# Patient Record
Sex: Female | Born: 1980 | Race: White | Hispanic: No | Marital: Married | Smoking: Never smoker
Health system: Southern US, Community
[De-identification: ages and names within clinical notes are randomized; demographics above are authoritative.]

## PROBLEM LIST (undated history)

## (undated) DIAGNOSIS — L408 Other psoriasis: Secondary | ICD-10-CM

## (undated) DIAGNOSIS — R229 Localized swelling, mass and lump, unspecified: Secondary | ICD-10-CM

## (undated) DIAGNOSIS — Z20828 Contact with and (suspected) exposure to other viral communicable diseases: Secondary | ICD-10-CM

## (undated) DIAGNOSIS — N390 Urinary tract infection, site not specified: Secondary | ICD-10-CM

## (undated) DIAGNOSIS — B343 Parvovirus infection, unspecified: Secondary | ICD-10-CM

## (undated) DIAGNOSIS — Z23 Encounter for immunization: Secondary | ICD-10-CM

## (undated) DIAGNOSIS — Z8619 Personal history of other infectious and parasitic diseases: Secondary | ICD-10-CM

## (undated) DIAGNOSIS — M545 Low back pain, unspecified: Secondary | ICD-10-CM

## (undated) DIAGNOSIS — O98519 Other viral diseases complicating pregnancy, unspecified trimester: Secondary | ICD-10-CM

## (undated) DIAGNOSIS — O368190 Decreased fetal movements, unspecified trimester, not applicable or unspecified: Secondary | ICD-10-CM

## (undated) HISTORY — DX: Decreased fetal movements, unspecified trimester, not applicable or unspecified: O36.8190

## (undated) HISTORY — DX: Other viral diseases complicating pregnancy, unspecified trimester: B34.3

## (undated) HISTORY — DX: Low back pain: M54.5

## (undated) HISTORY — DX: Other viral diseases complicating pregnancy, unspecified trimester: O98.519

## (undated) HISTORY — DX: Personal history of other infectious and parasitic diseases: Z86.19

## (undated) HISTORY — DX: Contact with and (suspected) exposure to other viral communicable diseases: Z20.828

## (undated) HISTORY — DX: Localized swelling, mass and lump, unspecified: R22.9

## (undated) HISTORY — DX: Encounter for immunization: Z23

## (undated) HISTORY — DX: Urinary tract infection, site not specified: N39.0

## (undated) HISTORY — DX: Other psoriasis: L40.8

## (undated) HISTORY — DX: Low back pain, unspecified: M54.50

---

## 2004-01-30 ENCOUNTER — Other Ambulatory Visit: Admission: RE | Admit: 2004-01-30 | Discharge: 2004-01-30 | Payer: Self-pay | Admitting: Obstetrics and Gynecology

## 2005-04-21 ENCOUNTER — Other Ambulatory Visit: Admission: RE | Admit: 2005-04-21 | Discharge: 2005-04-21 | Payer: Self-pay | Admitting: Obstetrics and Gynecology

## 2006-07-28 HISTORY — PX: CHOLECYSTECTOMY: SHX55

## 2006-08-24 ENCOUNTER — Encounter: Payer: Self-pay | Admitting: Maternal & Fetal Medicine

## 2007-01-15 ENCOUNTER — Observation Stay: Payer: Self-pay | Admitting: Obstetrics and Gynecology

## 2007-01-24 ENCOUNTER — Observation Stay: Payer: Self-pay | Admitting: Certified Nurse Midwife

## 2007-01-25 ENCOUNTER — Inpatient Hospital Stay: Payer: Self-pay | Admitting: Obstetrics and Gynecology

## 2007-01-25 ENCOUNTER — Observation Stay: Payer: Self-pay | Admitting: Certified Nurse Midwife

## 2007-02-22 ENCOUNTER — Observation Stay (HOSPITAL_COMMUNITY): Admission: EM | Admit: 2007-02-22 | Discharge: 2007-02-22 | Payer: Self-pay | Admitting: Family Medicine

## 2007-02-22 ENCOUNTER — Encounter (INDEPENDENT_AMBULATORY_CARE_PROVIDER_SITE_OTHER): Payer: Self-pay | Admitting: General Surgery

## 2007-06-16 ENCOUNTER — Encounter: Admission: RE | Admit: 2007-06-16 | Discharge: 2007-06-16 | Payer: Self-pay | Admitting: Family Medicine

## 2009-01-11 ENCOUNTER — Encounter: Admission: RE | Admit: 2009-01-11 | Discharge: 2009-01-11 | Payer: Self-pay | Admitting: Internal Medicine

## 2010-07-28 NOTE — L&D Delivery Note (Signed)
Delivery Note  Complete dilation at 2029 Onset of pushing at 2035 FHR second stage 135 - mild variables audible to 100 with pushing  Anesthesia: epidural  Delivery of a viable female at 2055 by CNM in LOA position.  Nuchal Cord x1 reduced down body at birth Cord double clamped after cessation of pulsation, cut by FOB.  Cord blood sample collected.  Placenta delivered shultz intact with 3 VC.  Placenta to pathology for parvo infection antepartum. Uterine tone firm bleeding small  supraurethral laceration identified with brisk bleeding - repair with 4-0 vicryl / good hemostasis - no extension to urethra Repair partial 1st degree perineal / 4-0 vicryl subcuticular x 4 Est. Blood Loss (mL):   Complications: none  Mom to postpartum.  Baby to nursery-stable.  BAILEY,TANYA 07/02/2011, 9:22 PM

## 2010-12-10 NOTE — H&P (Signed)
[name], Harrington NO.:  [id number]   MEDICAL RECORD NO.:  [id number]          PATIENT TYPE:  EMS   LOCATION:  MAJO                         FACILITY:  [facility]   PHYSICIAN:  Adolph Pollack, M.D.DATE OF BIRTH:  Nov 26, 1980   DATE OF ADMISSION:  02/21/2007  DATE OF DISCHARGE:                              HISTORY & PHYSICAL   CHIEF COMPLAINT:  Worsening upper abdominal pain.   HISTORY OF PRESENT ILLNESS:  Jenna Harrington is a 30 year old female who is 3  weeks postpartum.  3-4 days ago she developed some pressure-type upper  abdominal pain that had become worse and radiated to the right upper  quadrant.  There has been some nausea and vomiting a few days ago.  She  had a low-grade temperature of 100.2 yesterday and presented to the  emergency department.  Initially she went to Urgent Care and was then  sent to the emergency department and evaluated with a laboratory and an  ultrasound.  The ultrasound demonstrated cholelithiasis with a stone  impacted in the neck of the gallbladder and borderline wall thickening.  She had tenderness in the right upper quadrant.  At this point we were  asked to see her.  She did get a dose of IV Rocephin.   PAST MEDICAL HISTORY:  No chronic illnesses.   PAST SURGICAL HISTORY:  None.   ALLERGIES:  NONE.   MEDICATIONS:  Prenatal vitamins.   SOCIAL HISTORY:  She is married and here with her husband.  No tobacco  or alcohol use.   FAMILY HISTORY:  Negative for gallbladder disease.   REVIEW OF SYSTEMS:  CARDIOVASCULAR:  No hypertension, heart disease.  PULMONARY:  No pneumonia, asthma, COPD.  GI:  No peptic ulcer disease,  hiatal hernia.  GU:  No dysuria, hematuria, or increased frequency.  ENDOCRINE:  No diabetes or hypercholesterolemia.  NEUROLOGIC:  No  seizure disorders.  HEMATOLOGIC:  No bleeding disorders, transfusions,  or blood clots.   PHYSICAL EXAMINATION:  GENERAL:  A mildly overweight female.  She  appears slightly  ill but is in no acute distress.  VITAL SIGNS:  Temperature is 98.8, blood pressure 131/83, pulse 87, O2  saturations 97% on room air.  HEENT:  Eyes:  Extraocular movements intact.  No icterus.  NECK:  Supple without obvious masses.  RESPIRATORY:  Breath sounds equal and clear.  Respirations unlabored.  CARDIOVASCULAR:  Regular rate, regular rhythm.  No murmur.  EXTREMITIES:  No lower extremity edema.  ABDOMEN:  Soft.  There is some right upper quadrant tenderness and  guarding with a Murphy's sign.  Active bowel sounds heard.  No obvious  masses.  MUSCULOSKELETAL:  Good range of motion.  No cyanosis.  SKIN:  No jaundice.   LABORATORY DATA:  White cell count is normal at 9800, hemoglobin 12.9.  Liver function tests within normal limits.  Lipase normal.  Urinalysis  demonstrates many epithelial cells, 21-50 white blood cells, 3-6 red  blood cells, few bacteria.   Ultrasound reviewed.   IMPRESSION:  1. Evolving acute cholecystitis.  2. Possible urinary tract infection, although increased urinary  epithelial cells points to potential contaminated specimen.   PLAN:  Will admit and check a urine culture.  Will start IV antibiotics.  I discussed with her laparoscopic possible open cholecystectomy.  I went  over the procedure and risks including but not limited to bleeding,  infection, wound healing problems, anesthesia, bile leak, accidental  damage to the intestine/liver/bile duct and postcholecystectomy  diarrhea.  She appears to understand this and agrees with the plan.      Adolph Pollack, M.D.  Electronically Signed     TJR/MEDQ  D:  02/22/2007  T:  02/22/2007  Job:  [id number]

## 2010-12-10 NOTE — Op Note (Signed)
[name], Jenna Harrington NO.:  [id number]   MEDICAL RECORD NO.:  [id number]          PATIENT TYPE:  INP   LOCATION:  5703                         FACILITY:  [facility]   PHYSICIAN:  Gabrielle Dare. Janee Morn, M.D.DATE OF BIRTH:  July 24, 1981   DATE OF PROCEDURE:  02/22/2007  DATE OF DISCHARGE:                               OPERATIVE REPORT   PREOPERATIVE DIAGNOSIS:  Acute cholecystitis.   POSTOPERATIVE DIAGNOSIS:  Acute cholecystitis.   PROCEDURE:  Laparoscopic cholecystectomy with intraoperative  cholangiogram.   SURGEON:  Violeta Gelinas, M.D.   ASSISTANT:  Sandria Bales. Ezzard Standing, M.D.   ANESTHESIA:  General.   HISTORY OF PRESENT ILLNESS:  The patient is 30 year old female was  admitted earlier this morning by my partner Dr. Abbey Harrington with acute  cholecystitis.  Liver function tests are normal.  White blood cell count  is within normal limits.  She continues to have pain the right upper  quadrant.  She is brought to operating room for cholecystectomy with  cholangiogram.   PROCEDURE IN DETAIL:  Informed consent was obtained the patient is  receiving intravenous antibiotics and she was identified.  She is  brought to the operating room.  General anesthesia was administered. Her  abdomen was prepped and draped in sterile fashion, infraumbilical region  was infiltrated with quarter percent Marcaine with epinephrine.  Infraumbilical incision was made.  Subcutaneous tissues were dissected  down revealing anterior fascia.  This was divided sharply, peritoneal  cavity was entered under direct vision without difficulty.  The 0 Vicryl  pursestring suture was placed around the fascial opening the Hassan  trocar was inserted into the abdomen.  The abdomen was insufflated with  carbon dioxide in standard fashion. Under direct vision an 11 mm  epigastric and two 5-mm lateral ports were placed. 0.25% Marcaine with  epinephrine was used at all port sites. The gallbladder was noted be  acutely inflamed.  It was very edematous and distended. It was drained  with a new shock drainage system.  This was pale yellow bile. The dome  was then retracted superior medially.  Some filmy omental adhesions were  then gradually swept away with care, eventually the infundibulum was  revealed this was retracted inferolaterally.  Further dissection began  laterally and progressed medially easily identifying the cystic duct.  Dissection continued to a large window was created between the cystic  duct infundibulum and the liver.  Once this was done with excellent  visualization, a clip was placed on the infundibulum cystic duct  junction. Small nick was made in the cystic duct and a Reddick  cholangiogram catheter was inserted.  Intraoperative cholangiogram was  obtained.  This demonstrated no common bile duct filling defects and  good flow of contrast into the duodenum cholangiogram catheter was  removed.  Three clips were placed proximally on the cystic duct and it  was divided.  Further dissection revealed the anterior branch of the  cystic artery.  This was clipped twice proximally once distally and  divided. Gallbladder was taken off the liver bed with Bovie cautery.  We  did come across the  posterior branch of cystic artery.  This was clipped  twice proximally and once distally and divided as well. The gallbladder  was taken off the liver bed again with Bovie cautery.  We encountered  one more vein that was clipped proximally and divided distally with  cautery.  Gallbladder was placed in EndoCatch bag and taken out the  abdomen via the infraumbilical port site.  The abdomen was copiously  irrigated.  Meticulous hemostasis was obtained in the liver bed.  All  clips were in good position and liver bed was dry.  Irrigation fluid was  evacuated.  It was clear. Liver bed and clips were checked one more time  and the area was dry.  The ports were then removed under direct vision.   Pneumoperitoneum was released, the infraumbilical fascia was closed by  tying 0 Vicryl pursestring suture with care not trap any intra-abdominal  contents. That was done after removal of the gallbladder inside the  EndoCatch bag via the infraumbilical port site. All four wounds were  copiously irrigated.  Skin of each was closed with running 4-0 Vicryl  subcuticular stitch.  Sponge, needle, instrument counts were correct.  Benzoin, Steri-Strips and sterile dressings were applied.  The patient  tolerated procedure well without apparent complication was taken to  recovery in stable condition.      Gabrielle Dare Janee Morn, M.D.  Electronically Signed     BET/MEDQ  D:  02/22/2007  T:  02/22/2007  Job:  [id number]

## 2010-12-16 LAB — HEPATITIS B SURFACE ANTIGEN: Hepatitis B Surface Ag: NEGATIVE

## 2010-12-16 LAB — ABO/RH: RH Type: NEGATIVE

## 2010-12-16 LAB — HIV ANTIBODY (ROUTINE TESTING W REFLEX): HIV: NONREACTIVE

## 2011-03-03 ENCOUNTER — Other Ambulatory Visit (HOSPITAL_COMMUNITY): Payer: Self-pay

## 2011-03-03 DIAGNOSIS — O2690 Pregnancy related conditions, unspecified, unspecified trimester: Secondary | ICD-10-CM

## 2011-03-03 DIAGNOSIS — Z20828 Contact with and (suspected) exposure to other viral communicable diseases: Secondary | ICD-10-CM

## 2011-03-05 ENCOUNTER — Ambulatory Visit (HOSPITAL_COMMUNITY)
Admission: RE | Admit: 2011-03-05 | Discharge: 2011-03-05 | Disposition: A | Discharge status: Home / Self Care | Payer: 59 | Source: Ambulatory Visit

## 2011-03-05 ENCOUNTER — Other Ambulatory Visit (HOSPITAL_COMMUNITY): Payer: Self-pay

## 2011-03-05 ENCOUNTER — Encounter (HOSPITAL_COMMUNITY): Payer: Self-pay

## 2011-03-05 DIAGNOSIS — Z20828 Contact with and (suspected) exposure to other viral communicable diseases: Secondary | ICD-10-CM

## 2011-03-05 DIAGNOSIS — O358XX0 Maternal care for other (suspected) fetal abnormality and damage, not applicable or unspecified: Secondary | ICD-10-CM | POA: Insufficient documentation

## 2011-03-05 DIAGNOSIS — O2690 Pregnancy related conditions, unspecified, unspecified trimester: Secondary | ICD-10-CM

## 2011-03-05 DIAGNOSIS — Z363 Encounter for antenatal screening for malformations: Secondary | ICD-10-CM | POA: Insufficient documentation

## 2011-03-05 DIAGNOSIS — Z1389 Encounter for screening for other disorder: Secondary | ICD-10-CM | POA: Insufficient documentation

## 2011-03-05 NOTE — Progress Notes (Signed)
Name: Jenna Harrington Medical Record Number:  [id number] Date of Birth: 25-Aug-1980 Date of Service: 03/05/2011  ORAH Harrington was seen today for prenatal diagnosis secondary to recent parvovirus exposure at the request of Dr. Arlan Organ.  The patient is a 30 y.o. G2P1001, with an EDD of 07/09/11 based on 21 weeks ultrasound.  Her daughter was recently diagnosed with a parvovirus infections.  The patient's IgM was positive and her IgG was equivocal on 02/19/11 suggesting a primary maternal exposure.  The patient has not had any symptoms of infection.  The patient has had weekly ultrasounds since the parvovirus exposure was discovered.  No signs of hydrops were seen.   She was seen today for ultrasound evaluation and maternal fetal medicine consultation.    OB History G1 NSVD at term  Past Medical and Surgical History The patient has no history of chronic medical diseases or prior surgeries.  Family History The patient has a maternal niece with spina bifida.  The father of the baby has a paternal cousin has a cleft lip and another paternal cousin with Downs Syndrome.  The patient and the father of the baby have no other family history of mental retardation, birth defects, or genetic diseases.  Social History The patient does not use tobacco, alcohol, or other illicit drugs.  Physical Examination: BP 97/55, Pulse 93, Weight 198 pounds. General appearance - alert, well appearing, and in no distress Abdomen - soft, nontender, nondistended, gravid.  Fetal Ultrasound A fetal anatomic survey was performed and no fetal anomalies or soft markers of aneuploidy were seen.  MCA peak systolic velocities were moderately elevated at 1.32 MoM.  For full details please see accompanying ultrasound report.  Recommendations 1.  Parvovirus exposure:  The patient's parvovirus serologies suggest a primary maternal infection.  This carries a risk of fetal infection and secondary anemia due to bone marrow  suppression.  I discussed the possibility of proceeding with amniocentesis to determine whether or not fetal infection has occurred, but the patient declined given the risk of miscarriage associated with the procedure.  The MCA dopplers reveal a moderately elevated peak systolic velocity of 1.32 MoM raising concern for moderate anemia; however, a single value can be difficult to interpret.  Although elevated the MCA dopplers do not indicate severe anemia or the need to proceed with fetal blood sampling and possible transfusion at this time.  We will repeat MCA dopplers in one week and weekly thereafter for 10 weeks.  If peak systolic velocity increases above 1.5 MoM we will proceed with fetal blood sampling and in-utero transfusion.  Rema Fendt

## 2011-03-10 ENCOUNTER — Other Ambulatory Visit (HOSPITAL_COMMUNITY): Payer: Self-pay

## 2011-03-10 ENCOUNTER — Ambulatory Visit (HOSPITAL_COMMUNITY)
Admission: RE | Admit: 2011-03-10 | Discharge: 2011-03-10 | Disposition: A | Discharge status: Home / Self Care | Payer: 59 | Source: Ambulatory Visit

## 2011-03-10 DIAGNOSIS — O2690 Pregnancy related conditions, unspecified, unspecified trimester: Secondary | ICD-10-CM

## 2011-03-10 DIAGNOSIS — Z20828 Contact with and (suspected) exposure to other viral communicable diseases: Secondary | ICD-10-CM

## 2011-03-10 DIAGNOSIS — Z3689 Encounter for other specified antenatal screening: Secondary | ICD-10-CM | POA: Insufficient documentation

## 2011-03-17 ENCOUNTER — Ambulatory Visit (HOSPITAL_COMMUNITY)
Admission: RE | Admit: 2011-03-17 | Discharge: 2011-03-17 | Disposition: A | Discharge status: Home / Self Care | Payer: 59 | Source: Ambulatory Visit

## 2011-03-17 DIAGNOSIS — Z20828 Contact with and (suspected) exposure to other viral communicable diseases: Secondary | ICD-10-CM

## 2011-03-17 DIAGNOSIS — Z3689 Encounter for other specified antenatal screening: Secondary | ICD-10-CM | POA: Insufficient documentation

## 2011-03-17 DIAGNOSIS — O2690 Pregnancy related conditions, unspecified, unspecified trimester: Secondary | ICD-10-CM

## 2011-03-17 NOTE — Progress Notes (Signed)
Vital signs reviewed; see ultrasound report 

## 2011-03-24 ENCOUNTER — Ambulatory Visit (HOSPITAL_COMMUNITY)
Admission: RE | Admit: 2011-03-24 | Discharge: 2011-03-24 | Disposition: A | Discharge status: Home / Self Care | Payer: 59 | Source: Ambulatory Visit

## 2011-03-24 DIAGNOSIS — O2690 Pregnancy related conditions, unspecified, unspecified trimester: Secondary | ICD-10-CM

## 2011-03-24 DIAGNOSIS — Z3689 Encounter for other specified antenatal screening: Secondary | ICD-10-CM | POA: Insufficient documentation

## 2011-03-24 DIAGNOSIS — Z20828 Contact with and (suspected) exposure to other viral communicable diseases: Secondary | ICD-10-CM

## 2011-04-01 ENCOUNTER — Other Ambulatory Visit (HOSPITAL_COMMUNITY): Payer: Self-pay

## 2011-04-01 ENCOUNTER — Ambulatory Visit (HOSPITAL_COMMUNITY)
Admission: RE | Admit: 2011-04-01 | Discharge: 2011-04-01 | Disposition: A | Discharge status: Home / Self Care | Payer: 59 | Source: Ambulatory Visit

## 2011-04-01 DIAGNOSIS — O2690 Pregnancy related conditions, unspecified, unspecified trimester: Secondary | ICD-10-CM

## 2011-04-01 DIAGNOSIS — Z3689 Encounter for other specified antenatal screening: Secondary | ICD-10-CM | POA: Insufficient documentation

## 2011-04-01 DIAGNOSIS — Z20828 Contact with and (suspected) exposure to other viral communicable diseases: Secondary | ICD-10-CM

## 2011-04-07 ENCOUNTER — Other Ambulatory Visit (HOSPITAL_COMMUNITY): Payer: 59

## 2011-04-07 ENCOUNTER — Ambulatory Visit (HOSPITAL_COMMUNITY)
Admission: RE | Admit: 2011-04-07 | Discharge: 2011-04-07 | Disposition: A | Discharge status: Home / Self Care | Payer: 59 | Source: Ambulatory Visit

## 2011-04-07 DIAGNOSIS — O2690 Pregnancy related conditions, unspecified, unspecified trimester: Secondary | ICD-10-CM

## 2011-04-07 DIAGNOSIS — Z20828 Contact with and (suspected) exposure to other viral communicable diseases: Secondary | ICD-10-CM

## 2011-04-07 DIAGNOSIS — Z3689 Encounter for other specified antenatal screening: Secondary | ICD-10-CM | POA: Insufficient documentation

## 2011-04-07 NOTE — Progress Notes (Signed)
See [place] report in [facility]

## 2011-04-10 ENCOUNTER — Ambulatory Visit (HOSPITAL_COMMUNITY)
Admission: RE | Admit: 2011-04-10 | Discharge: 2011-04-10 | Disposition: A | Discharge status: Home / Self Care | Payer: 59 | Source: Ambulatory Visit

## 2011-04-10 ENCOUNTER — Other Ambulatory Visit (HOSPITAL_COMMUNITY): Payer: Self-pay

## 2011-04-10 ENCOUNTER — Other Ambulatory Visit (HOSPITAL_COMMUNITY): Payer: Self-pay | Admitting: Maternal and Fetal Medicine

## 2011-04-10 DIAGNOSIS — Z20828 Contact with and (suspected) exposure to other viral communicable diseases: Secondary | ICD-10-CM

## 2011-04-10 DIAGNOSIS — O2690 Pregnancy related conditions, unspecified, unspecified trimester: Secondary | ICD-10-CM

## 2011-04-10 DIAGNOSIS — Z3689 Encounter for other specified antenatal screening: Secondary | ICD-10-CM | POA: Insufficient documentation

## 2011-04-14 ENCOUNTER — Ambulatory Visit (HOSPITAL_COMMUNITY)
Admission: RE | Admit: 2011-04-14 | Discharge: 2011-04-14 | Disposition: A | Discharge status: Home / Self Care | Payer: 59 | Source: Ambulatory Visit

## 2011-04-14 ENCOUNTER — Other Ambulatory Visit (HOSPITAL_COMMUNITY): Payer: 59

## 2011-04-14 DIAGNOSIS — Z20828 Contact with and (suspected) exposure to other viral communicable diseases: Secondary | ICD-10-CM

## 2011-04-14 DIAGNOSIS — O2690 Pregnancy related conditions, unspecified, unspecified trimester: Secondary | ICD-10-CM

## 2011-04-14 DIAGNOSIS — Z3689 Encounter for other specified antenatal screening: Secondary | ICD-10-CM | POA: Insufficient documentation

## 2011-04-14 NOTE — Progress Notes (Signed)
Ultrasound in [facility].  Follow up U/S scheduled. No evidence for fetal anemia.

## 2011-04-21 ENCOUNTER — Ambulatory Visit (HOSPITAL_COMMUNITY)
Admission: RE | Admit: 2011-04-21 | Discharge: 2011-04-21 | Disposition: A | Discharge status: Home / Self Care | Payer: 59 | Source: Ambulatory Visit

## 2011-04-21 ENCOUNTER — Other Ambulatory Visit (HOSPITAL_COMMUNITY): Payer: 59

## 2011-04-21 ENCOUNTER — Other Ambulatory Visit (HOSPITAL_COMMUNITY): Payer: Self-pay | Admitting: Maternal and Fetal Medicine

## 2011-04-21 DIAGNOSIS — Z3689 Encounter for other specified antenatal screening: Secondary | ICD-10-CM | POA: Insufficient documentation

## 2011-04-21 DIAGNOSIS — Z20828 Contact with and (suspected) exposure to other viral communicable diseases: Secondary | ICD-10-CM

## 2011-04-21 DIAGNOSIS — O2690 Pregnancy related conditions, unspecified, unspecified trimester: Secondary | ICD-10-CM

## 2011-04-24 ENCOUNTER — Ambulatory Visit (HOSPITAL_COMMUNITY)
Admission: RE | Admit: 2011-04-24 | Discharge: 2011-04-24 | Disposition: A | Discharge status: Home / Self Care | Payer: 59 | Source: Ambulatory Visit | Attending: Maternal and Fetal Medicine | Admitting: Maternal and Fetal Medicine

## 2011-04-24 ENCOUNTER — Ambulatory Visit (HOSPITAL_COMMUNITY)
Admission: RE | Admit: 2011-04-24 | Discharge: 2011-04-24 | Disposition: A | Discharge status: Home / Self Care | Payer: 59 | Source: Ambulatory Visit

## 2011-04-24 DIAGNOSIS — Z3689 Encounter for other specified antenatal screening: Secondary | ICD-10-CM | POA: Insufficient documentation

## 2011-04-24 DIAGNOSIS — O2690 Pregnancy related conditions, unspecified, unspecified trimester: Secondary | ICD-10-CM

## 2011-04-24 DIAGNOSIS — Z20828 Contact with and (suspected) exposure to other viral communicable diseases: Secondary | ICD-10-CM

## 2011-04-28 ENCOUNTER — Other Ambulatory Visit (HOSPITAL_COMMUNITY): Payer: 59

## 2011-04-28 ENCOUNTER — Ambulatory Visit (HOSPITAL_COMMUNITY)
Admission: RE | Admit: 2011-04-28 | Payer: 59 | Source: Ambulatory Visit | Attending: Maternal and Fetal Medicine | Admitting: Maternal and Fetal Medicine

## 2011-05-01 ENCOUNTER — Ambulatory Visit (HOSPITAL_COMMUNITY)
Admission: RE | Admit: 2011-05-01 | Discharge: 2011-05-01 | Disposition: A | Discharge status: Home / Self Care | Payer: 59 | Source: Ambulatory Visit | Attending: Maternal and Fetal Medicine | Admitting: Maternal and Fetal Medicine

## 2011-05-01 DIAGNOSIS — Z3689 Encounter for other specified antenatal screening: Secondary | ICD-10-CM | POA: Insufficient documentation

## 2011-05-01 DIAGNOSIS — Z20828 Contact with and (suspected) exposure to other viral communicable diseases: Secondary | ICD-10-CM

## 2011-05-01 DIAGNOSIS — O2690 Pregnancy related conditions, unspecified, unspecified trimester: Secondary | ICD-10-CM

## 2011-05-05 ENCOUNTER — Other Ambulatory Visit (HOSPITAL_COMMUNITY): Payer: 59

## 2011-05-05 ENCOUNTER — Ambulatory Visit (HOSPITAL_COMMUNITY): Payer: 59

## 2011-05-07 ENCOUNTER — Ambulatory Visit (HOSPITAL_COMMUNITY)
Admission: RE | Admit: 2011-05-07 | Discharge: 2011-05-07 | Disposition: A | Discharge status: Home / Self Care | Payer: 59 | Source: Ambulatory Visit

## 2011-05-07 DIAGNOSIS — Z20828 Contact with and (suspected) exposure to other viral communicable diseases: Secondary | ICD-10-CM

## 2011-05-07 DIAGNOSIS — O2690 Pregnancy related conditions, unspecified, unspecified trimester: Secondary | ICD-10-CM

## 2011-05-07 DIAGNOSIS — Z3689 Encounter for other specified antenatal screening: Secondary | ICD-10-CM | POA: Insufficient documentation

## 2011-05-12 LAB — DIFFERENTIAL
Lymphocytes Relative: 27
Monocytes Absolute: 0.9 — ABNORMAL HIGH
Monocytes Relative: 9
Neutro Abs: 6
Neutrophils Relative %: 62

## 2011-05-12 LAB — URINE MICROSCOPIC-ADD ON

## 2011-05-12 LAB — URINALYSIS, ROUTINE W REFLEX MICROSCOPIC
Glucose, UA: NEGATIVE
Protein, ur: NEGATIVE
Specific Gravity, Urine: 1.011
Urobilinogen, UA: 0.2

## 2011-05-12 LAB — CBC
HCT: 38.1
MCHC: 34
MCV: 86.2
Platelets: 287
RDW: 13.3

## 2011-05-12 LAB — URINE CULTURE: Colony Count: >=100000

## 2011-05-12 LAB — COMPREHENSIVE METABOLIC PANEL
Albumin: 3.4 — ABNORMAL LOW
BUN: 6
Calcium: 8.8
Creatinine, Ser: 0.71
Glucose, Bld: 96
Total Protein: 6.5

## 2011-05-14 ENCOUNTER — Ambulatory Visit (HOSPITAL_COMMUNITY)
Admission: RE | Admit: 2011-05-14 | Discharge: 2011-05-14 | Disposition: A | Discharge status: Home / Self Care | Payer: 59 | Source: Ambulatory Visit

## 2011-05-14 DIAGNOSIS — Z20828 Contact with and (suspected) exposure to other viral communicable diseases: Secondary | ICD-10-CM

## 2011-05-14 DIAGNOSIS — O99891 Other specified diseases and conditions complicating pregnancy: Secondary | ICD-10-CM | POA: Insufficient documentation

## 2011-05-14 DIAGNOSIS — Z3689 Encounter for other specified antenatal screening: Secondary | ICD-10-CM | POA: Insufficient documentation

## 2011-05-14 NOTE — Progress Notes (Signed)
Report in [facility]; follow-up as needed 

## 2011-05-16 ENCOUNTER — Ambulatory Visit (HOSPITAL_COMMUNITY)
Admission: RE | Admit: 2011-05-16 | Discharge: 2011-05-16 | Disposition: A | Discharge status: Home / Self Care | Payer: 59 | Source: Ambulatory Visit

## 2011-05-16 ENCOUNTER — Other Ambulatory Visit (HOSPITAL_COMMUNITY): Payer: Self-pay | Admitting: Obstetrics and Gynecology

## 2011-05-16 DIAGNOSIS — Z20828 Contact with and (suspected) exposure to other viral communicable diseases: Secondary | ICD-10-CM

## 2011-05-16 DIAGNOSIS — Z3689 Encounter for other specified antenatal screening: Secondary | ICD-10-CM | POA: Insufficient documentation

## 2011-05-20 ENCOUNTER — Ambulatory Visit (HOSPITAL_COMMUNITY)
Admission: RE | Admit: 2011-05-20 | Discharge: 2011-05-20 | Disposition: A | Discharge status: Home / Self Care | Payer: 59 | Source: Ambulatory Visit | Attending: Internal Medicine | Admitting: Internal Medicine

## 2011-05-20 ENCOUNTER — Other Ambulatory Visit (HOSPITAL_COMMUNITY): Payer: Self-pay | Admitting: Maternal and Fetal Medicine

## 2011-05-20 ENCOUNTER — Other Ambulatory Visit (HOSPITAL_COMMUNITY): Payer: Self-pay | Admitting: Obstetrics and Gynecology

## 2011-05-20 VITALS — BP 114/60 | HR 95 | Wt 204.0 lb

## 2011-05-20 DIAGNOSIS — Z3689 Encounter for other specified antenatal screening: Secondary | ICD-10-CM | POA: Insufficient documentation

## 2011-05-20 DIAGNOSIS — Z20828 Contact with and (suspected) exposure to other viral communicable diseases: Secondary | ICD-10-CM

## 2011-05-20 NOTE — Progress Notes (Cosign Needed)
Patient seen for ultrasound appointment today.  Please see [facility] report for details.  

## 2011-05-23 ENCOUNTER — Other Ambulatory Visit (HOSPITAL_COMMUNITY): Payer: Self-pay | Admitting: Obstetrics and Gynecology

## 2011-05-23 ENCOUNTER — Ambulatory Visit (HOSPITAL_COMMUNITY)
Admission: RE | Admit: 2011-05-23 | Discharge: 2011-05-23 | Disposition: A | Discharge status: Home / Self Care | Payer: 59 | Source: Ambulatory Visit | Attending: Obstetrics and Gynecology | Admitting: Obstetrics and Gynecology

## 2011-05-23 DIAGNOSIS — Z3689 Encounter for other specified antenatal screening: Secondary | ICD-10-CM | POA: Insufficient documentation

## 2011-05-23 DIAGNOSIS — Z20828 Contact with and (suspected) exposure to other viral communicable diseases: Secondary | ICD-10-CM

## 2011-05-23 NOTE — Progress Notes (Signed)
Encounter addended by: Marlana Latus, RN on: 05/23/2011 11:40 AM<BR>     Documentation filed: Episodes, Chief Complaint Section

## 2011-05-23 NOTE — Progress Notes (Signed)
Obstetric ultrasound performed today.  Please see report in [facility]. 

## 2011-05-27 ENCOUNTER — Ambulatory Visit (HOSPITAL_COMMUNITY)
Admission: RE | Admit: 2011-05-27 | Discharge: 2011-05-27 | Disposition: A | Discharge status: Home / Self Care | Payer: 59 | Source: Ambulatory Visit | Attending: Obstetrics and Gynecology | Admitting: Obstetrics and Gynecology

## 2011-05-27 ENCOUNTER — Other Ambulatory Visit (HOSPITAL_COMMUNITY): Payer: Self-pay | Admitting: Obstetrics and Gynecology

## 2011-05-27 DIAGNOSIS — Z20828 Contact with and (suspected) exposure to other viral communicable diseases: Secondary | ICD-10-CM

## 2011-05-27 DIAGNOSIS — Z3689 Encounter for other specified antenatal screening: Secondary | ICD-10-CM | POA: Insufficient documentation

## 2011-06-02 ENCOUNTER — Ambulatory Visit (HOSPITAL_COMMUNITY)
Admission: RE | Admit: 2011-06-02 | Discharge: 2011-06-02 | Disposition: A | Discharge status: Home / Self Care | Payer: 59 | Source: Ambulatory Visit | Attending: Certified Nurse Midwife | Admitting: Certified Nurse Midwife

## 2011-06-02 ENCOUNTER — Ambulatory Visit (HOSPITAL_COMMUNITY)
Admission: RE | Admit: 2011-06-02 | Discharge: 2011-06-02 | Disposition: A | Discharge status: Home / Self Care | Payer: 59 | Source: Ambulatory Visit | Attending: Obstetrics and Gynecology | Admitting: Obstetrics and Gynecology

## 2011-06-02 DIAGNOSIS — O99891 Other specified diseases and conditions complicating pregnancy: Secondary | ICD-10-CM | POA: Insufficient documentation

## 2011-06-02 DIAGNOSIS — Z20828 Contact with and (suspected) exposure to other viral communicable diseases: Secondary | ICD-10-CM

## 2011-06-02 DIAGNOSIS — Z3689 Encounter for other specified antenatal screening: Secondary | ICD-10-CM | POA: Insufficient documentation

## 2011-06-09 ENCOUNTER — Encounter (HOSPITAL_COMMUNITY): Payer: Self-pay

## 2011-06-09 ENCOUNTER — Ambulatory Visit (HOSPITAL_COMMUNITY)
Admission: RE | Admit: 2011-06-09 | Discharge: 2011-06-09 | Disposition: A | Discharge status: Home / Self Care | Payer: 59 | Source: Ambulatory Visit | Attending: Obstetrics and Gynecology | Admitting: Obstetrics and Gynecology

## 2011-06-09 DIAGNOSIS — Z20828 Contact with and (suspected) exposure to other viral communicable diseases: Secondary | ICD-10-CM

## 2011-06-09 DIAGNOSIS — Z3689 Encounter for other specified antenatal screening: Secondary | ICD-10-CM | POA: Insufficient documentation

## 2011-06-16 ENCOUNTER — Ambulatory Visit (HOSPITAL_COMMUNITY)
Admission: RE | Admit: 2011-06-16 | Discharge: 2011-06-16 | Disposition: A | Discharge status: Home / Self Care | Payer: 59 | Source: Ambulatory Visit | Attending: Obstetrics and Gynecology | Admitting: Obstetrics and Gynecology

## 2011-06-16 VITALS — BP 116/64 | HR 83 | Wt 207.0 lb

## 2011-06-16 DIAGNOSIS — B343 Parvovirus infection, unspecified: Secondary | ICD-10-CM

## 2011-06-16 DIAGNOSIS — O98519 Other viral diseases complicating pregnancy, unspecified trimester: Secondary | ICD-10-CM | POA: Insufficient documentation

## 2011-06-16 DIAGNOSIS — Z3689 Encounter for other specified antenatal screening: Secondary | ICD-10-CM | POA: Insufficient documentation

## 2011-06-16 DIAGNOSIS — Z20828 Contact with and (suspected) exposure to other viral communicable diseases: Secondary | ICD-10-CM

## 2011-06-16 NOTE — Progress Notes (Signed)
Report in [facility]; follow-up as needed 

## 2011-06-27 ENCOUNTER — Telehealth (HOSPITAL_COMMUNITY): Payer: Self-pay | Admitting: *Deleted

## 2011-06-27 ENCOUNTER — Encounter (HOSPITAL_COMMUNITY): Payer: Self-pay | Admitting: *Deleted

## 2011-06-27 NOTE — Telephone Encounter (Signed)
Preadmission screen  

## 2011-07-02 ENCOUNTER — Inpatient Hospital Stay (HOSPITAL_COMMUNITY): Payer: 59 | Admitting: Anesthesiology

## 2011-07-02 ENCOUNTER — Other Ambulatory Visit: Payer: Self-pay | Admitting: Certified Nurse Midwife

## 2011-07-02 ENCOUNTER — Inpatient Hospital Stay (HOSPITAL_COMMUNITY)
Admission: RE | Admit: 2011-07-02 | Discharge: 2011-07-04 | DRG: 775 | Disposition: A | Discharge status: Home / Self Care | Payer: 59 | Source: Ambulatory Visit | Attending: Obstetrics & Gynecology | Admitting: Obstetrics & Gynecology

## 2011-07-02 ENCOUNTER — Encounter (HOSPITAL_COMMUNITY): Payer: Self-pay | Admitting: Anesthesiology

## 2011-07-02 ENCOUNTER — Encounter (HOSPITAL_COMMUNITY): Payer: Self-pay

## 2011-07-02 DIAGNOSIS — D62 Acute posthemorrhagic anemia: Secondary | ICD-10-CM | POA: Diagnosis not present

## 2011-07-02 DIAGNOSIS — K649 Unspecified hemorrhoids: Secondary | ICD-10-CM | POA: Diagnosis present

## 2011-07-02 DIAGNOSIS — B343 Parvovirus infection, unspecified: Secondary | ICD-10-CM | POA: Diagnosis present

## 2011-07-02 DIAGNOSIS — O9903 Anemia complicating the puerperium: Secondary | ICD-10-CM | POA: Diagnosis not present

## 2011-07-02 DIAGNOSIS — O878 Other venous complications in the puerperium: Secondary | ICD-10-CM | POA: Diagnosis present

## 2011-07-02 LAB — CBC
HCT: 34.1 % — ABNORMAL LOW (ref 36.0–46.0)
Hemoglobin: 11.5 g/dL — ABNORMAL LOW (ref 12.0–15.0)
MCH: 29.6 pg (ref 26.0–34.0)
MCHC: 33.7 g/dL (ref 30.0–36.0)
MCV: 87.9 fL (ref 78.0–100.0)
Platelets: 166 10*3/uL (ref 150–400)
RBC: 3.88 MIL/uL (ref 3.87–5.11)
RDW: 14.4 % (ref 11.5–15.5)
WBC: 9.8 10*3/uL (ref 4.0–10.5)

## 2011-07-02 LAB — RPR: RPR Ser Ql: NONREACTIVE

## 2011-07-02 MED ORDER — NALBUPHINE SYRINGE 5 MG/0.5 ML
5.0000 mg | INJECTION | Freq: Once | INTRAMUSCULAR | Status: DC
  Filled 2011-07-02 (×2): qty 0.5

## 2011-07-02 MED ORDER — DIPHENHYDRAMINE HCL 50 MG/ML IJ SOLN: 12.5000 mg | INTRAMUSCULAR | Status: DC | PRN

## 2011-07-02 MED ORDER — LIDOCAINE HCL 1.5 % IJ SOLN
INTRAMUSCULAR | Status: DC | PRN
  Administered 2011-07-02 (×2): 5 mL via INTRADERMAL
  Administered 2011-07-02: 2 mL via INTRADERMAL

## 2011-07-02 MED ORDER — PHENYLEPHRINE 40 MCG/ML (10ML) SYRINGE FOR IV PUSH (FOR BLOOD PRESSURE SUPPORT): 80.0000 ug | PREFILLED_SYRINGE | INTRAVENOUS | Status: DC | PRN

## 2011-07-02 MED ORDER — LACTATED RINGERS IV SOLN: 500.0000 mL | INTRAVENOUS | Status: DC | PRN

## 2011-07-02 MED ORDER — EPHEDRINE 5 MG/ML INJ
10.0000 mg | INTRAVENOUS | Status: DC | PRN
  Filled 2011-07-02: qty 4

## 2011-07-02 MED ORDER — LACTATED RINGERS IV SOLN
INTRAVENOUS | Status: DC
  Administered 2011-07-02 (×3): via INTRAVENOUS

## 2011-07-02 MED ORDER — FENTANYL 2.5 MCG/ML BUPIVACAINE 1/10 % EPIDURAL INFUSION (WH - ANES)
14.0000 mL/h | INTRAMUSCULAR | Status: DC
  Administered 2011-07-02: 14 mL/h via EPIDURAL
  Filled 2011-07-02: qty 60

## 2011-07-02 MED ORDER — LIDOCAINE HCL (PF) 1 % IJ SOLN
30.0000 mL | INTRAMUSCULAR | Status: DC | PRN
  Filled 2011-07-02: qty 30

## 2011-07-02 MED ORDER — EPHEDRINE 5 MG/ML INJ: 10.0000 mg | INTRAVENOUS | Status: DC | PRN

## 2011-07-02 MED ORDER — IBUPROFEN 600 MG PO TABS: 600.0000 mg | ORAL_TABLET | Freq: Four times a day (QID) | ORAL | Status: DC | PRN

## 2011-07-02 MED ORDER — ACETAMINOPHEN 325 MG PO TABS: 650.0000 mg | ORAL_TABLET | ORAL | Status: DC | PRN

## 2011-07-02 MED ORDER — CITRIC ACID-SODIUM CITRATE 334-500 MG/5ML PO SOLN: 30.0000 mL | ORAL | Status: DC | PRN

## 2011-07-02 MED ORDER — PHENYLEPHRINE 40 MCG/ML (10ML) SYRINGE FOR IV PUSH (FOR BLOOD PRESSURE SUPPORT)
80.0000 ug | PREFILLED_SYRINGE | INTRAVENOUS | Status: DC | PRN
  Filled 2011-07-02: qty 5

## 2011-07-02 MED ORDER — OXYTOCIN 10 UNIT/ML IJ SOLN
INTRAMUSCULAR | Status: AC
  Filled 2011-07-02: qty 2

## 2011-07-02 MED ORDER — OXYTOCIN 20 UNITS IN LACTATED RINGERS INFUSION - SIMPLE
1.0000 m[IU]/min | INTRAVENOUS | Status: DC
  Administered 2011-07-02: 2 m[IU]/min via INTRAVENOUS
  Filled 2011-07-02: qty 1000

## 2011-07-02 MED ORDER — LACTATED RINGERS IV SOLN: 500.0000 mL | Freq: Once | INTRAVENOUS | Status: DC

## 2011-07-02 MED ORDER — NALOXONE HCL 0.4 MG/ML IJ SOLN
INTRAMUSCULAR | Status: AC
  Filled 2011-07-02: qty 1

## 2011-07-02 NOTE — Progress Notes (Signed)
Transferred to rm 135 via wheelchair, fob at side

## 2011-07-02 NOTE — Progress Notes (Signed)
Patient ID: Jenna Harrington, female   DOB: 11-Feb-1981, 30 y.o.   MRN: [id number]  S: Feeling ok - ctx stronger     Tolerating contractions well - states does not need any analgesia at this time     Unsure about epidural versus IV meds for pain management  O:  VS: Blood pressure 107/69, pulse 87, temperature 97.9 F (36.6 C), temperature source Oral, resp. rate 16, height 5\' 4"  (1.626 m), weight 95.709 kg (211 lb), unknown if currently breastfeeding.        FHR : baseline 135 / variability moderate / accels +  / decels none        EFM: Category 1        Toco: contractions every 2 minutes / pitocin 10 mu/min        Cervix : 4/80/vtx/-1/bbow        Membranes: AROM - clear  A:  active labor after successful IOL  P:  decrease pitocin to 6 mu/min x 1 hour then titrate up as needed labor support as needed  re-evaluate pain management options as labor progresses     [name] 07/02/2011, 2:24 PM

## 2011-07-02 NOTE — Anesthesia Procedure Notes (Signed)
Epidural Patient location during procedure: OB Start time: 07/02/2011 7:33 PM Reason for block: procedure for pain  Staffing Performed by: anesthesiologist   Preanesthetic Checklist Completed: patient identified, site marked, surgical consent, pre-op evaluation, timeout performed, IV checked, risks and benefits discussed and monitors and equipment checked  Epidural Patient position: sitting Prep: site prepped and draped and DuraPrep Patient monitoring: continuous pulse ox and blood pressure Approach: midline Injection technique: LOR air  Needle:  Needle type: Tuohy  Needle gauge: 17 G Needle length: 9 cm Needle insertion depth: 5 cm cm Catheter type: closed end flexible Catheter size: 19 Gauge Catheter at skin depth: 10 cm Test dose: negative  Assessment Events: blood not aspirated, injection not painful, no injection resistance, negative IV test and no paresthesia  Additional Notes Discussed risk of headache, infection, bleeding, nerve injury and failed or incomplete block.  Patient voices understanding and wishes to proceed.

## 2011-07-02 NOTE — H&P (Signed)
  OB ADMISSION/ HISTORY & PHYSICAL:  Admission Date: 07/02/2011  7:34 AM  Admit Diagnosis:  39 weeks parvo infection in pregnancy   Jenna Harrington is a 30 y.o. female presenting for induction of labor at 39 weeks per MFM recommendation.  Prenatal History: G2P1001   EDC : 07/08/2011, by Other Basis  Prenatal care at [facility] since [redacted] weeks gestation  Prenatal course complicated by parvo infection at 20 weeks - followed by MFM  Prenatal Labs: ABO, Rh: A (05/21 0000)  Antibody: Negative (05/21 0000) Rubella: Immune (05/21 0000)  * inaccurate entry per nursing staff - indeterminate rubella RPR: Nonreactive (05/21 0000)  HBsAg: Negative (05/21 0000)  HIV: Non-reactive (05/21 0000)  GBS: Negative (11/16 0000)  1 hr Glucola : normal   Medical / Surgical History :  Past medical history:  Past Medical History  Diagnosis Date  . Parvovirus complicating pregnancy, antepartum   . Low back pain   . Contact with or exposure to other viral diseases   . Decreased fetal movements, affecting management of mother, unspecified as to episode of care in pregnancy   . Need for prophylactic vaccination and inoculation against influenza   . Personal history of other infectious and parasitic disease     chicken pox  . Other psoriasis   . Localized superficial swelling, mass, or lump     skin nodule     Past surgical history:  Past Surgical History  Procedure Date  . Cholecystectomy 2008     Family History:  Family History  Problem Relation Age of Onset  . Hypotension Neg Hx   . Malignant hyperthermia Neg Hx   . Pseudochol deficiency Neg Hx   . Anesthesia problems Mother      Social History:  reports that she has never smoked. She has never used smokeless tobacco. She reports that she drinks alcohol. She reports that she does not use illicit drugs.   Allergies: Bee venom and Shellfish allergy    Current Medications at time of admission:  No current  facility-administered medications for this encounter.    Review of Systems: Few cramps - occasional ctx  Active FM No LOF  Physical Exam:  Dilation: 3 Effacement (%): 80 Exam by:: T. Fredric Mare CNM  General:pleasant , NAD Heart: RRR Lungs: clear Abdomen: gravid and non-tender / uterus non-tender Extremities: no edema Genitalia / VE: nl / 3cm / 80% / vtx / -2 FHR: cat 1  TOCO: UI with rare CTX   Assessment: 39 weeks with parvo infection in pregnancy Favorable Bishop Score with hx rapid active labor with first pregnancy   Plan:  Admit IOL - pitocin low dose AROM with active labor  Jenna Harrington 07/02/2011, 8:37 AM

## 2011-07-02 NOTE — H&P (Signed)
Reviewed, agree. Peds at delivery.

## 2011-07-02 NOTE — Anesthesia Preprocedure Evaluation (Signed)
Anesthesia Evaluation  Patient identified by MRN, date of birth, ID band Patient awake    Reviewed: Allergy & Precautions, H&P , NPO status , Patient's Chart, lab work & pertinent test results, reviewed documented beta blocker date and time   History of Anesthesia Complications Negative for: history of anesthetic complications  Airway Mallampati: III TM Distance: >3 FB Neck ROM: full    Dental  (+) Teeth Intact   Pulmonary neg pulmonary ROS,  clear to auscultation        Cardiovascular neg cardio ROS regular Normal    Neuro/Psych Negative Neurological ROS  Negative Psych ROS   GI/Hepatic negative GI ROS, Neg liver ROS,   Endo/Other  Negative Endocrine ROS  Renal/GU negative Renal ROS  Genitourinary negative   Musculoskeletal   Abdominal   Peds  Hematology negative hematology ROS (+)   Anesthesia Other Findings Had parvovirus during this pregnancy  Reproductive/Obstetrics (+) Pregnancy                           Anesthesia Physical Anesthesia Plan  ASA: II  Anesthesia Plan: Epidural   Post-op Pain Management:    Induction:   Airway Management Planned:   Additional Equipment:   Intra-op Plan:   Post-operative Plan:   Informed Consent: I have reviewed the patients History and Physical, chart, labs and discussed the procedure including the risks, benefits and alternatives for the proposed anesthesia with the patient or authorized representative who has indicated his/her understanding and acceptance.     Plan Discussed with:   Anesthesia Plan Comments:         Anesthesia Quick Evaluation

## 2011-07-02 NOTE — Progress Notes (Signed)
Patient ID: Jenna Harrington, female   DOB: 09/24/1980, 30 y.o.   MRN: [id number]  S: Feeling better since epidural  - some pressure      No urge to push yet  O:  VS: Blood pressure 127/61, pulse 79, temperature 98 F (36.7 C), temperature source Oral, resp. rate 18, height 5\' 4"  (1.626 m), weight 95.709 kg (211 lb), unknown if currently breastfeeding.        FHR : baseline 135 / variability moderate / accels +  / decels none         EFM: Category 1        Toco: contractions every 2-3 minutes / pitocin at 12 mu/min / MVU 150-170        Cervix : 9.5 / 100% / vtx +1        Membranes: clear fluid with show        Received single dose nubain 5mg  ~ 5pm  / epidural at 7:30pm  A: active labor  P: anticipate SVB in next hour     BAILEY,TANYA 07/02/2011, 8:12 PM

## 2011-07-03 ENCOUNTER — Encounter (HOSPITAL_COMMUNITY): Payer: Self-pay

## 2011-07-03 LAB — CBC
HCT: 27.2 % — ABNORMAL LOW (ref 36.0–46.0)
Hemoglobin: 9.4 g/dL — ABNORMAL LOW (ref 12.0–15.0)
MCH: 30.5 pg (ref 26.0–34.0)
MCHC: 34.6 g/dL (ref 30.0–36.0)
MCV: 88.3 fL (ref 78.0–100.0)
Platelets: 166 10*3/uL (ref 150–400)
RBC: 3.08 MIL/uL — ABNORMAL LOW (ref 3.87–5.11)
RDW: 14.4 % (ref 11.5–15.5)
WBC: 15 10*3/uL — ABNORMAL HIGH (ref 4.0–10.5)

## 2011-07-03 MED ORDER — DIBUCAINE 1 % RE OINT: 1.0000 "application " | TOPICAL_OINTMENT | RECTAL | Status: DC | PRN

## 2011-07-03 MED ORDER — SENNOSIDES-DOCUSATE SODIUM 8.6-50 MG PO TABS
2.0000 | ORAL_TABLET | Freq: Every day | ORAL | Status: DC
  Administered 2011-07-03: 2 via ORAL

## 2011-07-03 MED ORDER — IBUPROFEN 600 MG PO TABS
600.0000 mg | ORAL_TABLET | Freq: Four times a day (QID) | ORAL | Status: DC
  Administered 2011-07-03 – 2011-07-04 (×7): 600 mg via ORAL
  Filled 2011-07-03 (×7): qty 1

## 2011-07-03 MED ORDER — DIPHENHYDRAMINE HCL 25 MG PO CAPS: 25.0000 mg | ORAL_CAPSULE | Freq: Four times a day (QID) | ORAL | Status: DC | PRN

## 2011-07-03 MED ORDER — ONDANSETRON HCL 4 MG PO TABS: 4.0000 mg | ORAL_TABLET | ORAL | Status: DC | PRN

## 2011-07-03 MED ORDER — HYDROCORTISONE ACE-PRAMOXINE 2.5-1 % RE CREA
TOPICAL_CREAM | Freq: Three times a day (TID) | RECTAL | Status: DC
  Administered 2011-07-03: 11:00:00 via RECTAL
  Filled 2011-07-03: qty 30

## 2011-07-03 MED ORDER — ONDANSETRON HCL 4 MG/2ML IJ SOLN: 4.0000 mg | INTRAMUSCULAR | Status: DC | PRN

## 2011-07-03 MED ORDER — BENZOCAINE-MENTHOL 20-0.5 % EX AERO: 1.0000 "application " | INHALATION_SPRAY | CUTANEOUS | Status: DC | PRN

## 2011-07-03 MED ORDER — OXYCODONE-ACETAMINOPHEN 5-325 MG PO TABS
1.0000 | ORAL_TABLET | ORAL | Status: DC | PRN
  Administered 2011-07-03 – 2011-07-04 (×5): 1 via ORAL
  Filled 2011-07-03 (×5): qty 1

## 2011-07-03 MED ORDER — LANOLIN HYDROUS EX OINT
TOPICAL_OINTMENT | CUTANEOUS | Status: DC | PRN
Start: 2011-07-03 — End: 2011-07-04

## 2011-07-03 MED ORDER — PRENATAL PLUS 27-1 MG PO TABS
1.0000 | ORAL_TABLET | Freq: Every day | ORAL | Status: DC
  Administered 2011-07-03 – 2011-07-04 (×2): 1 via ORAL
  Filled 2011-07-03 (×2): qty 1

## 2011-07-03 MED ORDER — HYDROCORTISONE 2.5 % RE CREA
TOPICAL_CREAM | Freq: Three times a day (TID) | RECTAL | Status: DC
  Administered 2011-07-03 – 2011-07-04 (×3): via RECTAL
  Filled 2011-07-03: qty 28.35

## 2011-07-03 MED ORDER — WITCH HAZEL-GLYCERIN EX PADS
1.0000 "application " | MEDICATED_PAD | CUTANEOUS | Status: DC | PRN
  Administered 2011-07-03: 1 via TOPICAL

## 2011-07-03 MED ORDER — SIMETHICONE 80 MG PO CHEW: 80.0000 mg | CHEWABLE_TABLET | ORAL | Status: DC | PRN

## 2011-07-03 MED ORDER — BENZOCAINE-MENTHOL 20-0.5 % EX AERO
INHALATION_SPRAY | CUTANEOUS | Status: AC
  Administered 2011-07-03: 12:00:00
  Filled 2011-07-03: qty 56

## 2011-07-03 NOTE — Progress Notes (Addendum)
PPD 1 SVD  S:  Reports feeling well. (+) hemorrhoids             Tolerating po/ No nausea or vomiting             Bleeding is decreasing             Pain controlled withMotrin             Up ad lib / ambulatory  Newborn  Information for the patient's newborn:  Jenna, Rollo Girl Harrington [[id number]]  female  breast feeding    O:  A & O x 3 NAD             VS: Blood pressure 97/73, pulse 73, temperature 97.8 F (36.6 C), temperature source Oral, resp. rate 20, height 5\' 4"  (1.626 m), weight 95.709 kg (211 lb), last menstrual period 10/01/2010, unknown if currently breastfeeding.  LABS:  Basename 07/03/11 0548 07/02/11 0805  HGB 9.4* 11.5*  HCT 27.2* 34.1*    I&O:        Lungs: Clear and unlabored  Heart: regular rate and rhythm / no mumurs  Abdomen: soft, non-tender, non-distended              Fundus: firm, non-tender, U-1  Perineum: repair intact, mild edema  Hemorrhoids X 2, pink, swollen, tender, size of lima bean  Lochia: small  Extremities: +1 pedal edema, no calf pain or tenderness, neg Homans    A/P: PPD # 1 29 y.o., [id number] S/P:induced vaginal    Doing well - stable status  Routine post partum orders  Hemorrhoids - bowel regimen, Proctocream HC TID, sitz baths BID  Rh negative mother / infant Rh pos - plan Rhogam prior to D/C  Jenna Harrington, CNM, MSN 07/03/2011, 9:56 AM

## 2011-07-03 NOTE — Anesthesia Postprocedure Evaluation (Signed)
  Anesthesia Post-op Note  Patient: Jenna Harrington  Procedure(s) Performed: * No procedures listed *  Patient Location:   Anesthesia Type: Epidural  Level of Consciousness: awake, alert  and oriented  Airway and Oxygen Therapy: Patient Spontanous Breathing  Post-op Pain: mild  Post-op Assessment: Post-op Vital signs reviewed, Patient's Cardiovascular Status Stable, No headache, No backache, No residual numbness and No residual motor weakness  Post-op Vital Signs: Reviewed and stable  Complications: No apparent anesthesia complications

## 2011-07-04 MED ORDER — TETANUS-DIPHTH-ACELL PERTUSSIS 5-2.5-18.5 LF-MCG/0.5 IM SUSP
0.5000 mL | Freq: Once | INTRAMUSCULAR | Status: AC
  Administered 2011-07-04: 0.5 mL via INTRAMUSCULAR
  Filled 2011-07-04: qty 0.5

## 2011-07-04 MED ORDER — RHO D IMMUNE GLOBULIN 1500 UNIT/2ML IJ SOLN
300.0000 ug | Freq: Once | INTRAMUSCULAR | Status: AC
  Administered 2011-07-04: 300 ug via INTRAMUSCULAR
  Filled 2011-07-04: qty 2

## 2011-07-04 MED ORDER — POLYSACCHARIDE IRON 150 MG PO CAPS
150.0000 mg | ORAL_CAPSULE | Freq: Every day | ORAL | Status: DC
  Administered 2011-07-04: 150 mg via ORAL
  Filled 2011-07-04: qty 1

## 2011-07-04 MED ORDER — POLYSACCHARIDE IRON 150 MG PO CAPS: 150.0000 mg | ORAL_CAPSULE | Freq: Every day | ORAL | Status: DC

## 2011-07-04 MED ORDER — IBUPROFEN 600 MG PO TABS: 600.0000 mg | ORAL_TABLET | Freq: Four times a day (QID) | ORAL | Status: AC

## 2011-07-04 MED ORDER — OXYCODONE-ACETAMINOPHEN 5-325 MG PO TABS: 1.0000 | ORAL_TABLET | Freq: Four times a day (QID) | ORAL | Status: AC | PRN

## 2011-07-04 NOTE — Progress Notes (Signed)
Patient ID: Jenna Harrington, female   DOB: Nov 05, 1980, 30 y.o.   MRN: [id number] PPD # 2  Subjective: Pt reports feeling well and eager for d/c home/ Pain controlled with prescription NSAID's including motrin and narcotic analgesics including percocet Tolerating po/ Voiding without problems/ No n/v Bleeding is light/ Newborn info:  Information for the patient's newborn:  [name], [name] [[id number]]  female Feeding: breast    Objective:  VS: Blood pressure 98/60, pulse 66, temperature 97.4 F (36.3 C), temperature source Oral, resp. rate 18   Basename 07/03/11 0548 07/02/11 0805  WBC 15.0* 9.8  HGB 9.4* 11.5*  HCT 27.2* 34.1*  PLT 166 166    Blood type: --/--/A NEG (12/06 0548) Rubella: Immune (05/21 0000)    Physical Exam:  General: alert, cooperative and no distress Abdomen: soft, nontender, normal bowel sounds Uterine Fundus: firm, below umbilicus, nontender Perineum: healing with good reapproximation Lochia: minimal Ext: edema +1 to +2 pedal and pretib and Homans sign is negative, no sign of DVT   A/P: PPD # 2/ [id number] S/P SVD Mild ABL Anemia 07/10/11 @ 330 office visit for vag suture removal Doing well and stable for discharge home RX: Ibuprofen 600mg  po Q 6 hrs prn pain #30 Refill x 1 Niferex 150 po QD #0 Ref x 1 Percocet 5/325 1 to 2 po Q 4 hrs prn pain #15 No refill WOB/GYN booklet given Routine pp visit in 6wks  Signed: Arlana Lindau, [facility] 07/04/11 0930

## 2011-07-04 NOTE — Discharge Summary (Signed)
Obstetric Discharge Summary Reason for Admission: induction of labor and Hx of Parvo; followed by MFM and IOL recommended Prenatal Procedures: NST and ultrasound Intrapartum Procedures: spontaneous vaginal delivery Postpartum Procedures: MMR Complications-Operative and Postpartum: 1st degree perineal laceration and supraurethral lac Hemoglobin  Date Value Range Status  07/03/2011 9.4* 12.0-15.0 (g/dL) Final     DELTA CHECK NOTED     REPEATED TO VERIFY     HCT  Date Value Range Status  07/03/2011 27.2* 36.0-46.0 (%) Final    Discharge Diagnoses: Term Pregnancy-delivered  Discharge Information: Date: 07/04/2011 Activity: pelvic rest Diet: routine Medications: Ibuprofen, Iron and Percocet Condition: stable Instructions: refer to practice specific booklet Discharge to: home Follow-up Information    Follow up with BAILEY,TANYA in 6 weeks. (Suture removal on 07/10/11 @ 3:30 pm in office)    Contact information:   [address] [place] [address] [phone]          Newborn Data: Live born female on 07/02/11 Birth Weight: 7 lb 8.1 oz (3405 g) APGAR: 9, 9  Home with mother.  FISHER,JULIE K 07/04/2011, 9:38 AM

## 2011-07-05 LAB — RH IG WORKUP (INCLUDES ABO/RH)
Antibody Screen: NEGATIVE
Unit division: 0

## 2012-04-02 IMAGING — US US FETAL BPP W/O NONSTRESS
1 series · 6 of 6 positions shown · non-contrast
Comparison: none

[Series 1: us fetal bpp w/o nonstress · 0.23mm/px · 6 of 6 slices shown]
[im 1/6]
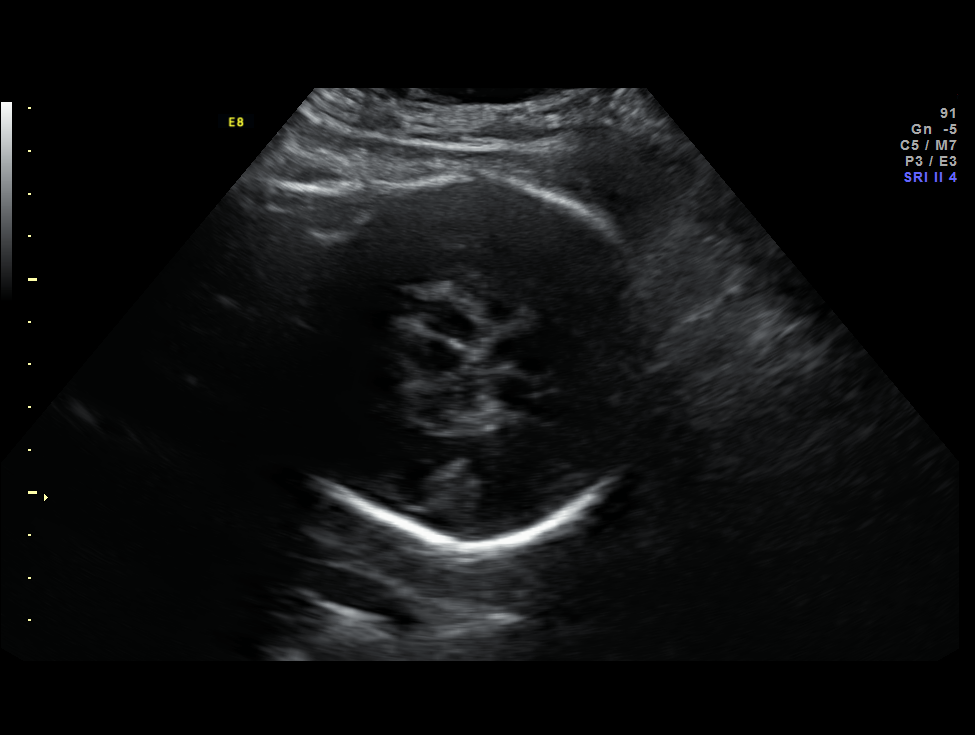
[im 2/6]
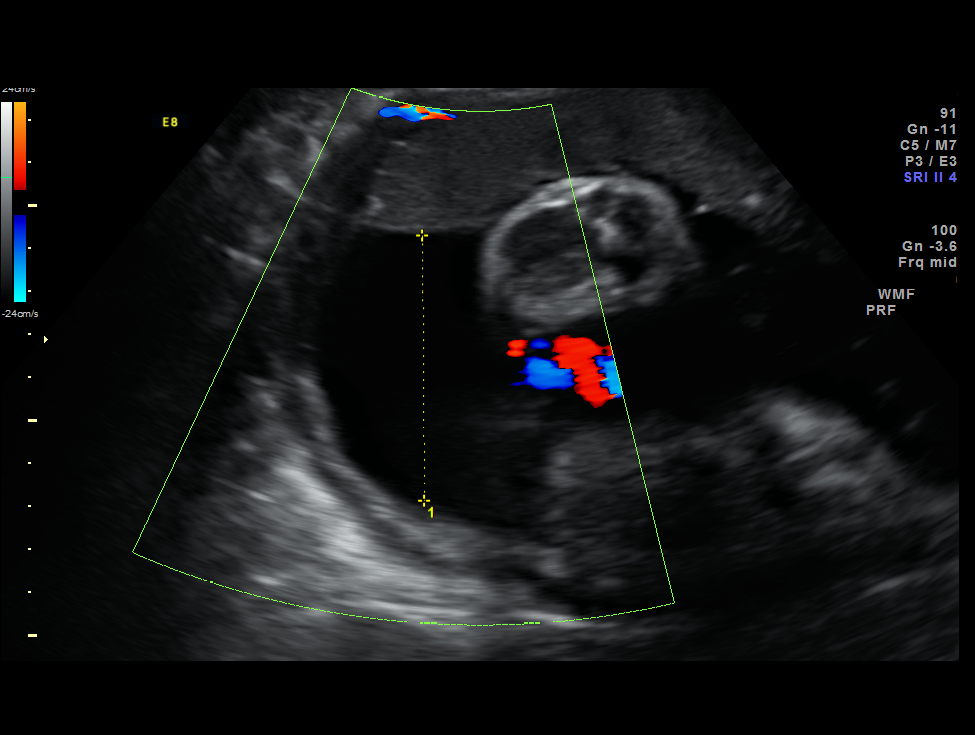
[im 3/6]
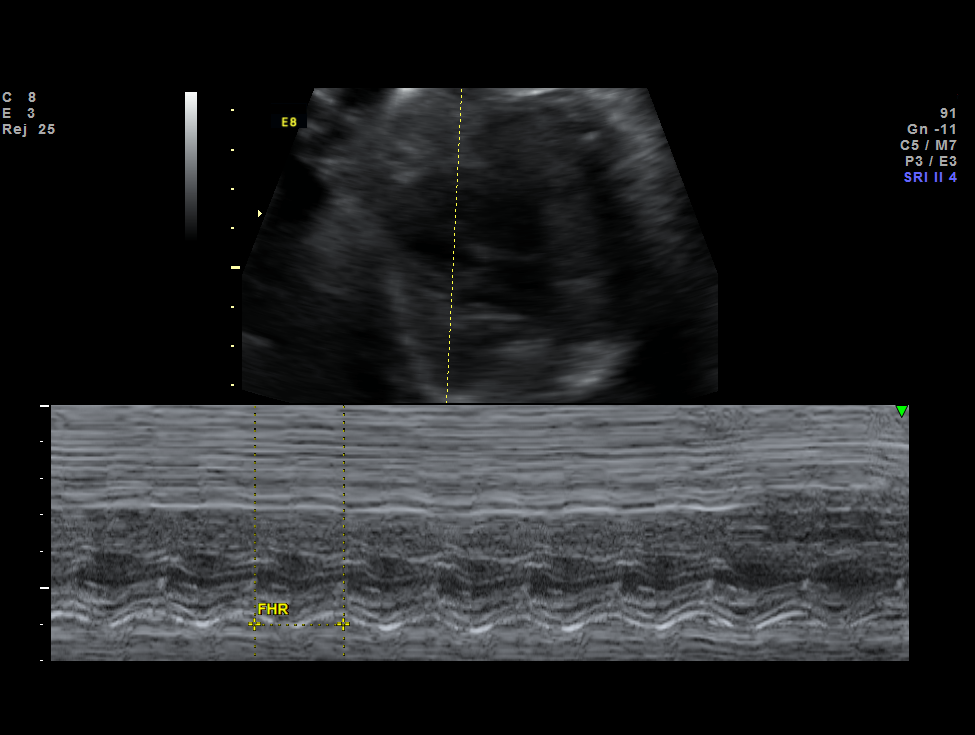
[im 4/6]
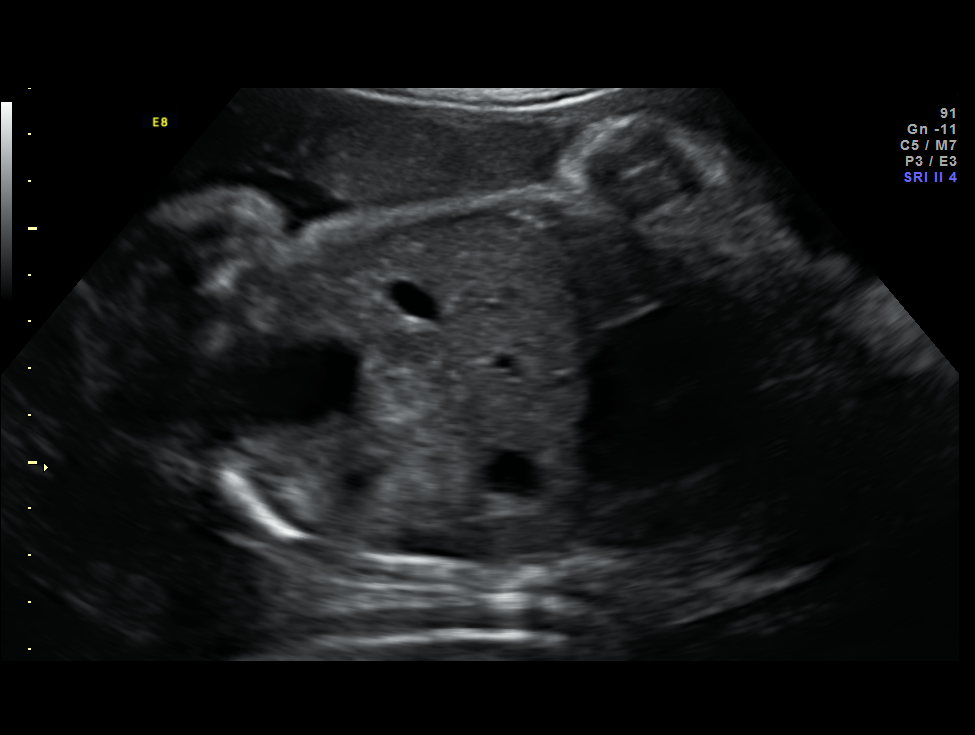
[im 5/6]
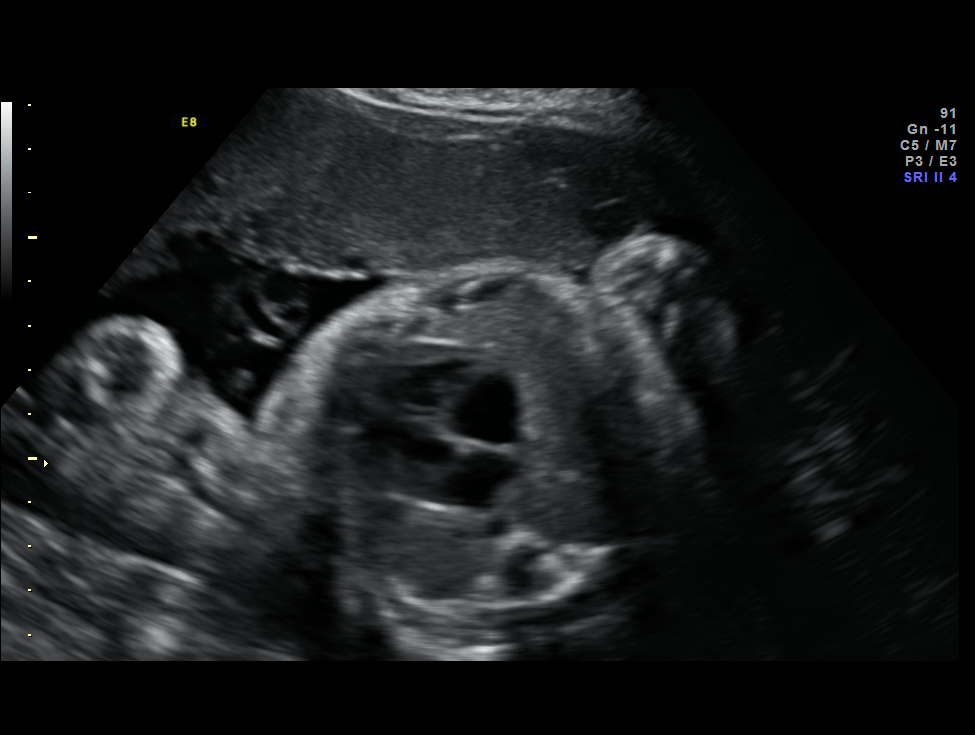
[im 6/6]
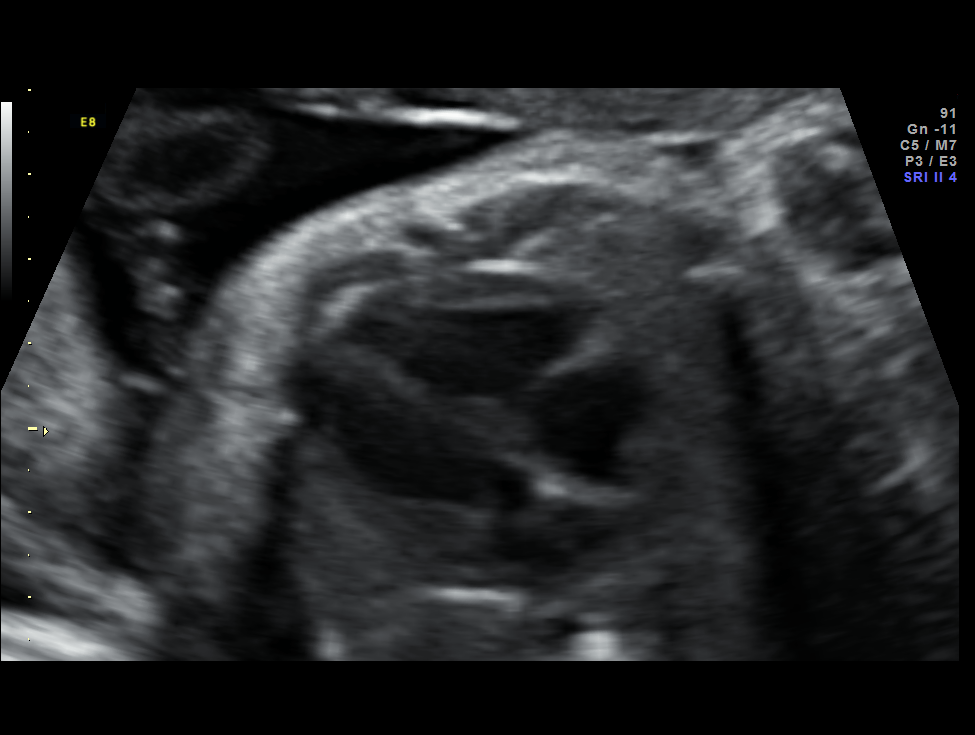

[6 of 6 positions shown; findings below may reference images not displayed]

Canned report from images found in remote index.

Refer to host system for actual result text.

## 2013-01-05 DIAGNOSIS — E669 Obesity, unspecified: Secondary | ICD-10-CM | POA: Insufficient documentation

## 2013-01-05 DIAGNOSIS — Z79899 Other long term (current) drug therapy: Secondary | ICD-10-CM | POA: Insufficient documentation

## 2013-01-05 HISTORY — DX: Other long term (current) drug therapy: Z79.899

## 2014-01-13 ENCOUNTER — Other Ambulatory Visit: Payer: Self-pay

## 2014-03-14 LAB — OB RESULTS CONSOLE RPR: RPR: NONREACTIVE

## 2014-03-14 LAB — OB RESULTS CONSOLE HIV ANTIBODY (ROUTINE TESTING): HIV: NONREACTIVE

## 2014-03-28 LAB — OB RESULTS CONSOLE GC/CHLAMYDIA
Chlamydia: NEGATIVE
Gonorrhea: NEGATIVE

## 2014-05-29 ENCOUNTER — Encounter (HOSPITAL_COMMUNITY): Payer: Self-pay

## 2014-07-28 NOTE — L&D Delivery Note (Signed)
Delivery Note  First Stage: Elective IOL 39 weeks for LGA: Pitocin Labor onset: 1309 Augmentation: AROM  Analgesia /Anesthesia intrapartum: Epidural  AROM at 1309  Second Stage: Complete dilation at 1746 Onset of pushing at 1747 FHR second stage: Category 2 tracing - Baseline FHR 140bmp, Moderate variability (6-25bmp), variable decelerations to a nadir of 118 bmp then progressed to Category 3 tracing with crowning with variable decelerations to a nadir of 70-80 bmp  Delivery of a viable Female at 7 by SNM/CNM in Direct OA position Loose nuchal cord x 1 and slid down body during delivery with a single wrap around left foot  Cord double clamped after cessation of pulsation, cut by FOB Cord blood sample collected   Arterial cord blood sample collected and sent  Results for Jenna Harrington, Jenna Harrington (MRN [id number]) as of 10/12/2014 19:15  Ref. Range 10/12/2014 18:34  pH cord blood No range found 7.227  pCO2 cord blood Latest Units: mmHg 65.0  Bicarbonate Latest Range: 20.0-24.0 mEq/L 26.1 (H)  TCO2 Latest Range: 0-100 mmol/L 28.1  Acid-base deficit Latest Range: 0.0-2.0 mmol/L 2.9 (H)    Third Stage: Placenta delivered Jenna Harrington intact with 3 VC @ 1815 - [place] noted  Placenta disposition: hospital  Uterine tone firm / bleeding small /  Brisk bleeding from supraurethral laceration - straight cath placed to identify urethral anatomy: urethra intact 4.0 vicryl x 3 interrupted sutures for hemostasis and pressure applied to area   1st degree perineal laceration identified: repaired with 4.0 vicryl with subcuticular closure Reassessment of supraurethral laceration - hemostatic 4.0 vicryl running suture to re-approximate mucosal laceration and pressure applied and reassessed - no further bleeding and Straight catheter removed   Anesthesia for repair: Epidural  Est. Blood Loss (mL): 765  Complications: none  Mom to postpartum.  Baby to Couplet care / Skin to Skin.  Newborn: Birth  Weight: Pending Apgar Scores: 8, 9 Feeding planned: Breast  Jenna Harrington, SNM

## 2014-09-18 LAB — OB RESULTS CONSOLE GBS: STREP GROUP B AG: POSITIVE

## 2014-10-12 ENCOUNTER — Inpatient Hospital Stay (HOSPITAL_COMMUNITY)
Admission: RE | Admit: 2014-10-12 | Discharge: 2014-10-14 | DRG: 775 | Disposition: A | Discharge status: Home / Self Care | Payer: 59 | Source: Ambulatory Visit | Attending: Obstetrics | Admitting: Obstetrics

## 2014-10-12 ENCOUNTER — Inpatient Hospital Stay (HOSPITAL_COMMUNITY): Payer: 59 | Admitting: Anesthesiology

## 2014-10-12 ENCOUNTER — Encounter (HOSPITAL_COMMUNITY): Payer: Self-pay

## 2014-10-12 DIAGNOSIS — D62 Acute posthemorrhagic anemia: Secondary | ICD-10-CM | POA: Diagnosis present

## 2014-10-12 DIAGNOSIS — Z3483 Encounter for supervision of other normal pregnancy, third trimester: Secondary | ICD-10-CM | POA: Diagnosis present

## 2014-10-12 DIAGNOSIS — O3660X0 Maternal care for excessive fetal growth, unspecified trimester, not applicable or unspecified: Secondary | ICD-10-CM | POA: Diagnosis present

## 2014-10-12 DIAGNOSIS — O99824 Streptococcus B carrier state complicating childbirth: Secondary | ICD-10-CM | POA: Diagnosis present

## 2014-10-12 DIAGNOSIS — O9902 Anemia complicating childbirth: Principal | ICD-10-CM | POA: Diagnosis present

## 2014-10-12 DIAGNOSIS — Z3A39 39 weeks gestation of pregnancy: Secondary | ICD-10-CM | POA: Diagnosis present

## 2014-10-12 LAB — CBC
HCT: 34.3 % — ABNORMAL LOW (ref 36.0–46.0)
Hemoglobin: 11.6 g/dL — ABNORMAL LOW (ref 12.0–15.0)
MCH: 29.6 pg (ref 26.0–34.0)
MCHC: 33.8 g/dL (ref 30.0–36.0)
MCV: 87.5 fL (ref 78.0–100.0)
Platelets: 153 10*3/uL (ref 150–400)
RBC: 3.92 MIL/uL (ref 3.87–5.11)
RDW: 14.5 % (ref 11.5–15.5)
WBC: 8.2 10*3/uL (ref 4.0–10.5)

## 2014-10-12 MED ORDER — CITRIC ACID-SODIUM CITRATE 334-500 MG/5ML PO SOLN: 30.0000 mL | ORAL | Status: DC | PRN

## 2014-10-12 MED ORDER — PHENYLEPHRINE 40 MCG/ML (10ML) SYRINGE FOR IV PUSH (FOR BLOOD PRESSURE SUPPORT): 80.0000 ug | PREFILLED_SYRINGE | INTRAVENOUS | Status: DC | PRN

## 2014-10-12 MED ORDER — OXYCODONE-ACETAMINOPHEN 5-325 MG PO TABS
2.0000 | ORAL_TABLET | ORAL | Status: DC | PRN
  Filled 2014-10-12: qty 2

## 2014-10-12 MED ORDER — OXYTOCIN 40 UNITS IN LACTATED RINGERS INFUSION - SIMPLE MED
1.0000 m[IU]/min | INTRAVENOUS | Status: DC
  Administered 2014-10-12: 2 m[IU]/min via INTRAVENOUS
  Filled 2014-10-12: qty 1000

## 2014-10-12 MED ORDER — SIMETHICONE 80 MG PO CHEW: 80.0000 mg | CHEWABLE_TABLET | ORAL | Status: DC | PRN

## 2014-10-12 MED ORDER — OXYCODONE-ACETAMINOPHEN 5-325 MG PO TABS
1.0000 | ORAL_TABLET | ORAL | Status: DC | PRN
  Administered 2014-10-13 – 2014-10-14 (×6): 1 via ORAL
  Filled 2014-10-12 (×5): qty 1

## 2014-10-12 MED ORDER — LIDOCAINE HCL (PF) 1 % IJ SOLN
INTRAMUSCULAR | Status: DC | PRN
  Administered 2014-10-12: 6 mL
  Administered 2014-10-12: 4 mL

## 2014-10-12 MED ORDER — LANOLIN HYDROUS EX OINT: TOPICAL_OINTMENT | CUTANEOUS | Status: DC | PRN

## 2014-10-12 MED ORDER — SENNOSIDES-DOCUSATE SODIUM 8.6-50 MG PO TABS
2.0000 | ORAL_TABLET | ORAL | Status: DC
  Administered 2014-10-13 (×2): 2 via ORAL
  Filled 2014-10-12 (×2): qty 2

## 2014-10-12 MED ORDER — PENICILLIN G POTASSIUM 5000000 UNITS IJ SOLR
2.5000 10*6.[IU] | INTRAVENOUS | Status: DC
  Administered 2014-10-12 (×2): 2.5 10*6.[IU] via INTRAVENOUS
  Filled 2014-10-12 (×5): qty 2.5

## 2014-10-12 MED ORDER — IBUPROFEN 600 MG PO TABS
600.0000 mg | ORAL_TABLET | Freq: Four times a day (QID) | ORAL | Status: DC
  Administered 2014-10-12 – 2014-10-14 (×7): 600 mg via ORAL
  Filled 2014-10-12 (×7): qty 1

## 2014-10-12 MED ORDER — LACTATED RINGERS IV SOLN
INTRAVENOUS | Status: DC
  Administered 2014-10-12 (×2): via INTRAVENOUS

## 2014-10-12 MED ORDER — ACETAMINOPHEN 325 MG PO TABS
650.0000 mg | ORAL_TABLET | ORAL | Status: DC | PRN
  Filled 2014-10-12: qty 2

## 2014-10-12 MED ORDER — LIDOCAINE HCL (PF) 1 % IJ SOLN: 30.0000 mL | INTRAMUSCULAR | Status: DC | PRN

## 2014-10-12 MED ORDER — EPHEDRINE 5 MG/ML INJ: 10.0000 mg | INTRAVENOUS | Status: DC | PRN

## 2014-10-12 MED ORDER — ACETAMINOPHEN 325 MG PO TABS: 650.0000 mg | ORAL_TABLET | ORAL | Status: DC | PRN

## 2014-10-12 MED ORDER — DIBUCAINE 1 % RE OINT: 1.0000 "application " | TOPICAL_OINTMENT | RECTAL | Status: DC | PRN

## 2014-10-12 MED ORDER — BENZOCAINE-MENTHOL 20-0.5 % EX AERO
1.0000 "application " | INHALATION_SPRAY | CUTANEOUS | Status: DC | PRN
  Administered 2014-10-12: 1 via TOPICAL
  Filled 2014-10-12: qty 56

## 2014-10-12 MED ORDER — DIPHENHYDRAMINE HCL 25 MG PO CAPS: 25.0000 mg | ORAL_CAPSULE | Freq: Four times a day (QID) | ORAL | Status: DC | PRN

## 2014-10-12 MED ORDER — WITCH HAZEL-GLYCERIN EX PADS: 1.0000 "application " | MEDICATED_PAD | CUTANEOUS | Status: DC | PRN

## 2014-10-12 MED ORDER — LACTATED RINGERS IV SOLN
500.0000 mL | Freq: Once | INTRAVENOUS | Status: AC
  Administered 2014-10-12: 500 mL via INTRAVENOUS

## 2014-10-12 MED ORDER — FENTANYL 2.5 MCG/ML BUPIVACAINE 1/10 % EPIDURAL INFUSION (WH - ANES): 14.0000 mL/h | INTRAMUSCULAR | Status: DC | PRN

## 2014-10-12 MED ORDER — LACTATED RINGERS IV SOLN
500.0000 mL | INTRAVENOUS | Status: DC | PRN
  Administered 2014-10-12: 300 mL via INTRAVENOUS

## 2014-10-12 MED ORDER — FENTANYL 2.5 MCG/ML BUPIVACAINE 1/10 % EPIDURAL INFUSION (WH - ANES)
14.0000 mL/h | INTRAMUSCULAR | Status: DC | PRN
  Administered 2014-10-12: 14 mL/h via EPIDURAL
  Filled 2014-10-12: qty 125

## 2014-10-12 MED ORDER — OXYTOCIN BOLUS FROM INFUSION
500.0000 mL | INTRAVENOUS | Status: DC
  Administered 2014-10-12: 500 mL via INTRAVENOUS

## 2014-10-12 MED ORDER — OXYTOCIN 40 UNITS IN LACTATED RINGERS INFUSION - SIMPLE MED: 62.5000 mL/h | INTRAVENOUS | Status: DC

## 2014-10-12 MED ORDER — PHENYLEPHRINE 40 MCG/ML (10ML) SYRINGE FOR IV PUSH (FOR BLOOD PRESSURE SUPPORT)
80.0000 ug | PREFILLED_SYRINGE | INTRAVENOUS | Status: DC | PRN
  Filled 2014-10-12: qty 20

## 2014-10-12 MED ORDER — PENICILLIN G POTASSIUM 5000000 UNITS IJ SOLR
5.0000 10*6.[IU] | Freq: Once | INTRAVENOUS | Status: AC
  Administered 2014-10-12: 5 10*6.[IU] via INTRAVENOUS
  Filled 2014-10-12: qty 5

## 2014-10-12 MED ORDER — OXYCODONE-ACETAMINOPHEN 5-325 MG PO TABS: 1.0000 | ORAL_TABLET | ORAL | Status: DC | PRN

## 2014-10-12 MED ORDER — DIPHENHYDRAMINE HCL 50 MG/ML IJ SOLN: 12.5000 mg | INTRAMUSCULAR | Status: DC | PRN

## 2014-10-12 MED ORDER — OXYCODONE-ACETAMINOPHEN 5-325 MG PO TABS: 2.0000 | ORAL_TABLET | ORAL | Status: DC | PRN

## 2014-10-12 NOTE — Progress Notes (Signed)
S:  Feeling more uncomfortable on right side       Starting to feel some rectal pressure  O:  VS: Blood pressure 106/62, pulse 87, temperature 98.1 F (36.7 C), temperature source Oral, resp. rate 18, height 5\' 5"  (1.651 m), weight 99.791 kg (220 lb), last menstrual period 01/12/2014, unknown if currently breastfeeding.        FHR : baseline 145/ variability Moderate (6-25bmp) / accelerations + / variable, early decelerations        Toco: contractions every 2-3 minutes / Moderate to palpation         Cervix : 6.5cm/80%/-1/vtx./ bloody show present        Membranes: AROM - clear  A: Active labor     FHR category 2  P: Continue Pitocin infusion       Alternate Exaggerated Sims position with peanut     Continuous labor support with husband / SNM / CNM     Will re-assess in 1-2 hours   Kassie Mends, SNM

## 2014-10-12 NOTE — Progress Notes (Signed)
Epidural catheter pulled by this RN at 2010.  Catheter intact.  Site covered with band-aid.

## 2014-10-12 NOTE — Anesthesia Preprocedure Evaluation (Signed)

## 2014-10-12 NOTE — Progress Notes (Signed)
S:  No pain or pressure - resting left side with peanut  O:  VS: Blood pressure 106/62, pulse 87, temperature 98.1 F (36.7 C), temperature source Oral, resp. rate 18, height 5\' 5"  (1.651 m), weight 99.791 kg (220 lb), last menstrual period 01/12/2014, unknown if currently breastfeeding.        FHR : baseline 140 / variability moderate / accelerations + / few variable and early decelerations        Toco: contractions every 3-4 minutes / 40-60 / mild to moderate / pitocin 12 mu/min        Cervix : 8cm / 90% / vtx -1 small caput / anterior asynclitic to cervix        Membranes: clear fluid        Foley draining 355ml dark amber urine  A: active labor     FHR category 2  P: increase mainline IVF - LR 362ml      Increase pitocin to increase ctx intensity to promote cervical progression   Artelia Laroche CNM, MSN, [facility] 10/12/2014, 4:59 PM

## 2014-10-12 NOTE — H&P (Signed)
  OB ADMISSION/ HISTORY & PHYSICAL:  Admission Date: 10/12/2014  7:34 AM  Admit Diagnosis: 39.[redacted] weeks pregnant / LGA  Jenna Harrington is a 34 y.o. female presenting for elective induction.  Prenatal History: [id number]   EDC : 10/19/2014  Prenatal care at [facility]  Primary Ob Provider: Artelia Laroche CNM [facility] Prenatal course uncomplicated  SONO @ 15.0 weeks - vtx / AFI 18.1 / EFW 8-3 @ 93% symmetrical  Prenatal Labs: ABO, Rh:   A negative Antibody:  negative / rhophylac January 2016 Rubella:   immune RPR:   NR HBsAg:   negative HIV:   NR GTT: abnormal 1hr / passed 3hr GBS:   POSITIVE  Medical / Surgical History :  Past medical history:  Past Medical History  Diagnosis Date  . Parvovirus complicating pregnancy, antepartum   . Low back pain   . Contact with or exposure to other viral diseases(V01.79)   . Decreased fetal movements, affecting management of mother, unspecified as to episode of care in pregnancy   . Need for prophylactic vaccination and inoculation against influenza   . Personal history of other infectious and parasitic disease     chicken pox  . Other psoriasis   . Localized superficial swelling, mass, or lump     skin nodule     Past surgical history:  Past Surgical History  Procedure Laterality Date  . Cholecystectomy  2008    Family History:  Family History  Problem Relation Age of Onset  . Hypotension Neg Hx   . Malignant hyperthermia Neg Hx   . Pseudochol deficiency Neg Hx   . Anesthesia problems Mother      Social History:  reports that she has never smoked. She has never used smokeless tobacco. She reports that she drinks alcohol. She reports that she does not use illicit drugs.   Allergies: Bee venom and Shellfish allergy    Current Medications at time of admission:  Prior to Admission medications   Medication Sig Start Date End Date Taking? Authorizing Provider  Docosahexaenoic Acid (DHA OMEGA 3 PO) Take 1 capsule  by mouth daily.     Historical Provider, MD  polysaccharide iron (NIFEREX) 150 MG CAPS capsule Take 1 capsule (150 mg total) by mouth daily. 07/04/11   Gustavo Lah, NP  prenatal vitamin w/FE, FA (PRENATAL 1 + 1) 27-1 MG TABS Take 1 tablet by mouth daily.     Historical Provider, MD   Review of Systems: Active FM Few ctx - no regular pattern  Physical Exam:  VS: Temperature 97.3 F (36.3 C), temperature source Oral, resp. rate 18, height 5\' 5"  (1.651 m), weight 99.791 kg (220 lb), unknown if currently breastfeeding.  General: alert and oriented, appears comfortable / no pain Heart: RRR Lungs: Clear lung fields Abdomen: Gravid, soft and non-tender, non-distended / uterus: gravid Extremities: trace  edema  Genitalia / VE:  3-4cm / 80% / vtx -2  FHR: baseline rate 140 / variability moderate / accelerations + / no decelerations TOCO: rare mild ctx  Assessment: 39.[redacted] weeks gestation induction of labor - elective for LGA FHR category 1   Plan:  Admit Pitocin AROM with regular ctx pattern after ABX initial dose completed  Dr Pamala Hurry notified of admission / plan of care   Artelia Laroche CNM, MSN, [facility] 10/12/2014, 8:21 AM

## 2014-10-13 LAB — CBC
HCT: 28.5 % — ABNORMAL LOW (ref 36.0–46.0)
Hemoglobin: 9.5 g/dL — ABNORMAL LOW (ref 12.0–15.0)
MCH: 29.3 pg (ref 26.0–34.0)
MCHC: 33.3 g/dL (ref 30.0–36.0)
MCV: 88 fL (ref 78.0–100.0)
Platelets: 157 10*3/uL (ref 150–400)
RBC: 3.24 MIL/uL — ABNORMAL LOW (ref 3.87–5.11)
RDW: 14.6 % (ref 11.5–15.5)
WBC: 11.7 10*3/uL — ABNORMAL HIGH (ref 4.0–10.5)

## 2014-10-13 LAB — RPR: RPR Ser Ql: NONREACTIVE

## 2014-10-13 MED ORDER — RHO D IMMUNE GLOBULIN 1500 UNIT/2ML IJ SOSY
300.0000 ug | PREFILLED_SYRINGE | Freq: Once | INTRAMUSCULAR | Status: AC
  Administered 2014-10-13: 300 ug via INTRAVENOUS
  Filled 2014-10-13: qty 2

## 2014-10-13 NOTE — Lactation Note (Signed)
This note was copied from the chart of Jenna Harrington. Lactation Consultation Note  Patient Name: Jenna Margery Szostak [id number] Date: 10/13/2014 Reason for consult: Initial assessment (baby sleepying , mom to call for feeding assess)  Baby is 40 plus hours old and is presently being held by mom , sound asleep. Also family visiting in room and siblings. Mom concerned baby has breast fed several times since birth but has been sleepy during today. LC reviewed breast feeding basics - and stressed the importance of skin to skin when baby isn't showing feeding  Cues to increase the breast feeding hormones. LC also recommended calling on the nurses light for [facility] for  feeding  Assessment.  Mother informed of post-discharge support and given phone number to the lactation department, including services for  phone call assistance; out-patient appointments; and breastfeeding support group. List of other breastfeeding resources  in the community given in the handout. Encouraged mother to call for problems or concerns related to breastfeeding.   Maternal Data Has patient been taught Hand Expression?:  (family visiting , waiting for mom to call for feeding assesment )  Feeding    LATCH Score/Interventions                Intervention(s): Breastfeeding basics reviewed     Lactation Tools Discussed/Used     Consult Status Consult Status: Follow-up Date: 10/13/14 Follow-up type: In-patient    Myer Haff 10/13/2014, 2:13 PM

## 2014-10-13 NOTE — Anesthesia Postprocedure Evaluation (Signed)
  Anesthesia Post-op Note  Patient: Jenna Harrington  Procedure(s) Performed: * No procedures listed *  Patient Location: Mother/Baby  Anesthesia Type:Epidural  Level of Consciousness: awake and alert   Airway and Oxygen Therapy: Patient Spontanous Breathing  Post-op Pain: mild  Post-op Assessment: Post-op Vital signs reviewed, Patient's Cardiovascular Status Stable, Respiratory Function Stable, No signs of Nausea or vomiting, Pain level controlled, No headache, No residual numbness and No residual motor weakness  Post-op Vital Signs: Reviewed  Last Vitals:  Filed Vitals:   10/13/14 0600  BP: 117/58  Pulse: 82  Temp: 36.6 C  Resp: 18    Complications: No apparent anesthesia complications

## 2014-10-13 NOTE — Progress Notes (Signed)
PPD #1- SVD  Subjective:   Reports feeling well Tolerating po/ No nausea or vomiting Bleeding is light Pain controlled with Motrin Up ad lib / ambulatory / voiding regularly without problems Newborn: breastfeeding  / Circumcision: planning   Objective:   VS:  VS:  Filed Vitals:   10/12/14 2030 10/12/14 2130 10/13/14 0200 10/13/14 0600  BP: 122/67 116/69 107/53 117/58  Pulse: 98 89 68 82  Temp: 97.7 F (36.5 C) 98.1 F (36.7 C) 98 F (36.7 C) 97.8 F (36.6 C)  TempSrc: Oral Oral Oral Oral  Resp: 18 18 18 18   Height:      Weight:        LABS:  Recent Labs  10/12/14 0840 10/13/14 0600  WBC 8.2 11.7*  HGB 11.6* 9.5*  PLT 153 157   Blood type: --/--/A NEG (03/17 0840) Rubella:   IMMUNE  I&O: Intake/Output      03/17 0701 - 03/18 0700 03/18 0701 - 03/19 0700   Blood 400    Total Output 400     Net -400          Urine Occurrence 1 x      Physical Exam: Alert and oriented x3 Abdomen: soft, non-tender, non-distended  Fundus: firm, non-tender, U-2 Perineum: Well approximated, no significant erythema, edema, or drainage; healing well. Lochia: small Extremities: Trace BLE edema, no calf pain or tenderness    Assessment:  PPD #1 [id number]/ S/P:induced vaginal, 1st degree perineal laceration, peri-urethral laceration ABL anemia  Doing well    Plan: Encourage frequent voids-q2 hrs Continue routine post partum orders Anticipate D/C home tomorrow   Julianne Handler, N MSN, CNM 10/13/2014, 9:18 AM

## 2014-10-13 NOTE — Lactation Note (Signed)
This note was copied from the chart of Jenna Harrington. Lactation Consultation Note  P3, Pumped w/ 2 older daughters and wants to be sure latch is correct. Mother latched baby in football hold.  Sucks and swallows observed, some w/ stimulation/massage. Reviewed how to achieve a deep latch, making sure baby is deep on areola. Provided mother w/ comfort gels. Encouraged her to call if she needs further assistance,   Patient Name: Jenna Harrington [id number] Date: 10/13/2014 Reason for consult: Follow-up assessment   Maternal Data Has patient been taught Hand Expression?:  (family visiting , waiting for mom to call for feeding assesment )  Feeding Feeding Type: Breast Fed  LATCH Score/Interventions Latch: Grasps breast easily, tongue down, lips flanged, rhythmical sucking.  Audible Swallowing: Spontaneous and intermittent  Type of Nipple: Everted at rest and after stimulation  Comfort (Breast/Nipple): Filling, red/small blisters or bruises, mild/mod discomfort  Problem noted: Mild/Moderate discomfort Interventions (Mild/moderate discomfort): Comfort gels;Hand expression  Hold (Positioning): Assistance needed to correctly position infant at breast and maintain latch. Intervention(s): Breastfeeding basics reviewed  LATCH Score: 8  Lactation Tools Discussed/Used     Consult Status Consult Status: Follow-up Date: 10/14/14 Follow-up type: In-patient    Vivianne Master [facility] 10/13/2014, 4:49 PM

## 2014-10-14 LAB — RH IG WORKUP (INCLUDES ABO/RH)
ABO/RH(D): A NEG
FETAL SCREEN: NEGATIVE
GESTATIONAL AGE(WKS): 39
Unit division: 0

## 2014-10-14 LAB — CBC
HCT: 25.9 % — ABNORMAL LOW (ref 36.0–46.0)
Hemoglobin: 8.7 g/dL — ABNORMAL LOW (ref 12.0–15.0)
MCH: 29.8 pg (ref 26.0–34.0)
MCHC: 33.6 g/dL (ref 30.0–36.0)
MCV: 88.7 fL (ref 78.0–100.0)
Platelets: 159 10*3/uL (ref 150–400)
RBC: 2.92 MIL/uL — ABNORMAL LOW (ref 3.87–5.11)
RDW: 14.7 % (ref 11.5–15.5)
WBC: 6.9 10*3/uL (ref 4.0–10.5)

## 2014-10-14 MED ORDER — POLYSACCHARIDE IRON COMPLEX 150 MG PO CAPS: 150.0000 mg | ORAL_CAPSULE | Freq: Two times a day (BID) | ORAL | Status: DC

## 2014-10-14 MED ORDER — OXYCODONE-ACETAMINOPHEN 5-325 MG PO TABS: 1.0000 | ORAL_TABLET | ORAL | Status: DC | PRN

## 2014-10-14 MED ORDER — IBUPROFEN 600 MG PO TABS: 600.0000 mg | ORAL_TABLET | Freq: Four times a day (QID) | ORAL | Status: DC

## 2014-10-14 MED ORDER — MAGNESIUM OXIDE -MG SUPPLEMENT 400 (240 MG) MG PO TABS: 1.0000 | ORAL_TABLET | Freq: Every day | ORAL | Status: DC

## 2014-10-14 NOTE — Discharge Summary (Signed)
Obstetric Discharge Summary  Reason for Admission: induction of labor Prenatal Procedures: ultrasound Intrapartum Procedures: spontaneous vaginal delivery and GBS prophylaxis Postpartum Procedures: Rho(D) Ig Complications-Operative and Postpartum: 1st degree perineal laceration and supraurethral HEMOGLOBIN  Date Value Ref Range Status  10/14/2014 8.7* 12.0 - 15.0 g/dL Final   HCT  Date Value Ref Range Status  10/14/2014 25.9* 36.0 - 46.0 % Final    Physical Exam:  General: alert, cooperative, no distress and pale Lochia: appropriate Uterine Fundus: firm Incision: healing well DVT Evaluation: No evidence of DVT seen on physical exam.  Discharge Diagnoses: Term Pregnancy-delivered and ABL anemia  Discharge Information: Date: 10/14/2014 Activity: pelvic rest Diet: routine Medications: PNV, Ibuprofen, Iron, Percocet and magnesium oxide Condition: stable Instructions: refer to practice specific booklet Discharge to: home Follow-up Information    Follow up with Artelia Laroche, CNM In 6 weeks.   Specialty:  Obstetrics and Gynecology   Contact information:   [place] [address] [phone]       Newborn Data: Live born female  Birth Weight: 8 lb 8.6 oz (3873 g) APGAR: 8, 9  Home with mother.  Artelia Laroche 10/14/2014, 1:34 PM

## 2014-10-14 NOTE — Progress Notes (Signed)
PPD #2, SVD, baby boy  S:  Reports feeling good, but tired - infant cluster feeding all night             Tolerating po/ No nausea or vomiting             Bleeding is light             Pain controlled with Motrin and Percocet             Up ad lib / ambulatory / voiding QS / no BM yet   Newborn breast feeding  - latching well / Circumcision - planning prior to discharge  O:               VS: BP 100/48 mmHg  Pulse 77  Temp(Src) 98.1 F (36.7 C) (Oral)  Resp 16  Ht 5\' 5"  (1.651 m)  Wt 99.791 kg (220 lb)  BMI 36.61 kg/m2  LMP 01/12/2014  Breastfeeding? Unknown   LABS:              Recent Labs  10/12/14 0840 10/13/14 0600  WBC 8.2 11.7*  HGB 11.6* 9.5*  PLT 153 157               Blood type: --/--/A NEG (03/18 0630)  Rubella:   Immune                               Physical Exam:             Alert and oriented X3  Lungs: Clear and unlabored  Heart: regular rate and rhythm / no mumurs  Abdomen: soft, non-tender, non-distended              Fundus: firm, non-tender, U-2  Perineum: well-approximated 1st degree laceration and supraurethral laceration - healing well; slightly edematous, no erythema, no ecchymosis   Lochia: scant  Extremities: +1 dependent BLE edema, no calf pain or tenderness    A: PPD # 2, SVD  Rh Negative - Rhogam given   ABL Anemia - asymptomatic, stable   Doing well - stable status  P: Discharge home today  WOB discharge book given and reviewed   Reviewed signs/symptoms to call   Continue taking Prenatal vitamin for anemia   Comfort measures for nipple soreness reviewed  Keep 6 week appointment at [place], SNM

## 2014-10-14 NOTE — Lactation Note (Signed)
This note was copied from the chart of Jenna Harrington. Lactation Consultation Note: Follow up visit with this experienced BF mom, She reports baby has been nursing well. No questions at present. To call prn  Patient Name: Jenna Harrington [id number] Date: 10/14/2014 Reason for consult: Follow-up assessment   Maternal Data    Feeding   LATCH Score/Interventions                      Lactation Tools Discussed/Used     Consult Status Consult Status: Complete    Truddie Crumble 10/14/2014, 11:19 AM

## 2014-10-15 LAB — TYPE AND SCREEN
ABO/RH(D): A NEG
Antibody Screen: POSITIVE
DAT, IgG: NEGATIVE
Unit division: 0
Unit division: 0

## 2014-11-29 ENCOUNTER — Other Ambulatory Visit: Payer: Self-pay | Admitting: Physician Assistant

## 2017-01-30 ENCOUNTER — Ambulatory Visit (INDEPENDENT_AMBULATORY_CARE_PROVIDER_SITE_OTHER): Payer: 59

## 2017-01-30 ENCOUNTER — Ambulatory Visit (INDEPENDENT_AMBULATORY_CARE_PROVIDER_SITE_OTHER): Payer: 59 | Admitting: Podiatry

## 2017-01-30 VITALS — BP 123/75 | HR 84 | Temp 98.8°F | Resp 14

## 2017-01-30 DIAGNOSIS — M79672 Pain in left foot: Secondary | ICD-10-CM

## 2017-01-30 DIAGNOSIS — M7752 Other enthesopathy of left foot: Secondary | ICD-10-CM | POA: Diagnosis not present

## 2017-01-30 MED ORDER — DICLOFENAC SODIUM 75 MG PO TBEC: 75.0000 mg | DELAYED_RELEASE_TABLET | Freq: Two times a day (BID) | ORAL | 1 refills | Status: DC

## 2017-02-03 NOTE — Progress Notes (Signed)
   HPI: Patient is a 36 year old female presenting today complaining of sharp, shooting, aching pain to the dorsum and lateral side of the left foot onset one month ago. She states she was running and hit the foot on a rock. Walking, standing and the end of the day makes the pain worse. She denies alleviating factors. She has taken Advil with no significant relief. She is here for further evaluation and treatment.     Physical Exam: General: The patient is alert and oriented x3 in no acute distress.  Dermatology: Skin is warm, dry and supple bilateral lower extremities. Negative for open lesions or macerations.  Vascular: Palpable pedal pulses bilaterally. No edema or erythema noted. Capillary refill within normal limits.  Neurological: Epicritic and protective threshold grossly intact bilaterally.   Musculoskeletal Exam: Pain with palpation to the 1st MPJ of the left foot. Range of motion within normal limits to all pedal and ankle joints bilateral. Muscle strength 5/5 in all groups bilateral.   Radiographic Exam:  Normal osseous mineralization. Joint spaces preserved. No fracture/dislocation/boney destruction.    Assessment: 1. 1st MPJ capsulitis   Plan of Care:  1. Patient was evaluated. X-Rays reviewed. 2. Injection of 0.5 mLs Celestone Soluspan injected into the left 1st MPJ. 3. Prescription for Diclofenac 75 mg #60 given to patient. 4. Recommended good shoe gear. 5. Return to clinic in 4 weeks.  Going to [facility] in [employer].    Edrick Kins, DPM [facility]  Dr. Edrick Kins, [place]                                        [place], [place] [address]                Office [phone]  Fax [phone]

## 2017-02-06 MED ORDER — BETAMETHASONE SOD PHOS & ACET 6 (3-3) MG/ML IJ SUSP: 3.0000 mg | Freq: Once | INTRAMUSCULAR | Status: DC

## 2017-02-27 ENCOUNTER — Ambulatory Visit: Payer: 59 | Admitting: Podiatry

## 2018-03-31 DIAGNOSIS — N390 Urinary tract infection, site not specified: Secondary | ICD-10-CM | POA: Diagnosis not present

## 2018-03-31 DIAGNOSIS — Z3200 Encounter for pregnancy test, result unknown: Secondary | ICD-10-CM | POA: Diagnosis not present

## 2018-03-31 DIAGNOSIS — R1031 Right lower quadrant pain: Secondary | ICD-10-CM | POA: Diagnosis not present

## 2018-03-31 DIAGNOSIS — R109 Unspecified abdominal pain: Secondary | ICD-10-CM | POA: Diagnosis not present

## 2018-04-05 DIAGNOSIS — R109 Unspecified abdominal pain: Secondary | ICD-10-CM | POA: Diagnosis not present

## 2018-04-22 ENCOUNTER — Ambulatory Visit: Payer: 59 | Admitting: Primary Care

## 2018-05-07 ENCOUNTER — Ambulatory Visit: Payer: 59 | Admitting: Primary Care

## 2018-05-11 DIAGNOSIS — M25551 Pain in right hip: Secondary | ICD-10-CM | POA: Diagnosis not present

## 2018-05-20 ENCOUNTER — Ambulatory Visit: Payer: 59 | Admitting: Primary Care

## 2018-05-20 ENCOUNTER — Other Ambulatory Visit (HOSPITAL_COMMUNITY)
Admission: RE | Admit: 2018-05-20 | Discharge: 2018-05-20 | Disposition: A | Discharge status: Home / Self Care | Payer: 59 | Source: Ambulatory Visit | Attending: Primary Care | Admitting: Primary Care

## 2018-05-20 ENCOUNTER — Encounter: Payer: Self-pay | Admitting: Primary Care

## 2018-05-20 ENCOUNTER — Encounter (INDEPENDENT_AMBULATORY_CARE_PROVIDER_SITE_OTHER): Payer: Self-pay

## 2018-05-20 VITALS — BP 122/82 | HR 76 | Temp 98.0°F | Ht 64.0 in | Wt 209.8 lb

## 2018-05-20 DIAGNOSIS — Z124 Encounter for screening for malignant neoplasm of cervix: Secondary | ICD-10-CM

## 2018-05-20 DIAGNOSIS — R7989 Other specified abnormal findings of blood chemistry: Secondary | ICD-10-CM

## 2018-05-20 DIAGNOSIS — Z0000 Encounter for general adult medical examination without abnormal findings: Secondary | ICD-10-CM

## 2018-05-20 LAB — COMPREHENSIVE METABOLIC PANEL
ALT: 31 U/L (ref 0–35)
AST: 15 U/L (ref 0–37)
Albumin: 4.4 g/dL (ref 3.5–5.2)
Alkaline Phosphatase: 37 U/L — ABNORMAL LOW (ref 39–117)
BUN: 13 mg/dL (ref 6–23)
CO2: 28 meq/L (ref 19–32)
CREATININE: 0.88 mg/dL (ref 0.40–1.20)
Calcium: 9.5 mg/dL (ref 8.4–10.5)
Chloride: 101 mEq/L (ref 96–112)
GFR: 76.51 mL/min (ref >60.00)
Glucose, Bld: 92 mg/dL (ref 70–99)
Potassium: 3.8 mEq/L (ref 3.5–5.1)
Sodium: 136 mEq/L (ref 135–145)
Total Bilirubin: 0.8 mg/dL (ref 0.2–1.2)
Total Protein: 7.3 g/dL (ref 6.0–8.3)

## 2018-05-20 LAB — LIPID PANEL
CHOLESTEROL: 228 mg/dL — AB (ref 0–200)
HDL: 48.7 mg/dL (ref >39.00)
NONHDL: 179.1
TRIGLYCERIDES: 247 mg/dL — AB (ref 0.0–149.0)
Total CHOL/HDL Ratio: 5
VLDL: 49.4 mg/dL — ABNORMAL HIGH (ref 0.0–40.0)

## 2018-05-20 LAB — T4, FREE: Free T4: 0.72 ng/dL (ref 0.60–1.60)

## 2018-05-20 LAB — LDL CHOLESTEROL, DIRECT: LDL DIRECT: 151 mg/dL

## 2018-05-20 LAB — TSH: TSH: 3.27 u[IU]/mL (ref 0.35–4.50)

## 2018-05-20 NOTE — Patient Instructions (Signed)
Stop by the lab prior to leaving today. I will notify you of your results once received.   Continue exercising. You should be getting 150 minutes of moderate intensity exercise weekly.  It's important to improve your diet by reducing consumption of fast food, fried food, processed snack foods. Increase consumption of fresh vegetables and fruits, whole grains, water.  Ensure you are drinking 64 ounces of water daily.  We will be in touch once we receive your pap smear results.   It was a pleasure to meet you today! Please don't hesitate to call or message me with any questions. Welcome to [employer]!   Preventive Care 18-39 Years, Female Preventive care refers to lifestyle choices and visits with your health care provider that can promote health and wellness. What does preventive care include?  A yearly physical exam. This is also called an annual well check.  Dental exams once or twice a year.  Routine eye exams. Ask your health care provider how often you should have your eyes checked.  Personal lifestyle choices, including: ? Daily care of your teeth and gums. ? Regular physical activity. ? Eating a healthy diet. ? Avoiding tobacco and drug use. ? Limiting alcohol use. ? Practicing safe sex. ? Taking vitamin and mineral supplements as recommended by your health care provider. What happens during an annual well check? The services and screenings done by your health care provider during your annual well check will depend on your age, overall health, lifestyle risk factors, and family history of disease. Counseling Your health care provider may ask you questions about your:  Alcohol use.  Tobacco use.  Drug use.  Emotional well-being.  Home and relationship well-being.  Sexual activity.  Eating habits.  Work and work Statistician.  Method of birth control.  Menstrual cycle.  Pregnancy history.  Screening You may have the following tests or  measurements:  Height, weight, and BMI.  Diabetes screening. This is done by checking your blood sugar (glucose) after you have not eaten for a while (fasting).  Blood pressure.  Lipid and cholesterol levels. These may be checked every 5 years starting at age 20.  Skin check.  Hepatitis C blood test.  Hepatitis B blood test.  Sexually transmitted disease (STD) testing.  BRCA-related cancer screening. This may be done if you have a family history of breast, ovarian, tubal, or peritoneal cancers.  Pelvic exam and Pap test. This may be done every 3 years starting at age 15. Starting at age 33, this may be done every 5 years if you have a Pap test in combination with an HPV test.  Discuss your test results, treatment options, and if necessary, the need for more tests with your health care provider. Vaccines Your health care provider may recommend certain vaccines, such as:  Influenza vaccine. This is recommended every year.  Tetanus, diphtheria, and acellular pertussis (Tdap, Td) vaccine. You may need a Td booster every 10 years.  Varicella vaccine. You may need this if you have not been vaccinated.  HPV vaccine. If you are 62 or younger, you may need three doses over 6 months.  Measles, mumps, and rubella (MMR) vaccine. You may need at least one dose of MMR. You may also need a second dose.  Pneumococcal 13-valent conjugate (PCV13) vaccine. You may need this if you have certain conditions and were not previously vaccinated.  Pneumococcal polysaccharide (PPSV23) vaccine. You may need one or two doses if you smoke cigarettes or if you have certain conditions.  Meningococcal vaccine. One dose is recommended if you are age 12-21 years and a first-year college student living in a residence hall, or if you have one of several medical conditions. You may also need additional booster doses.  Hepatitis A vaccine. You may need this if you have certain conditions or if you travel or work  in places where you may be exposed to hepatitis A.  Hepatitis B vaccine. You may need this if you have certain conditions or if you travel or work in places where you may be exposed to hepatitis B.  Haemophilus influenzae type b (Hib) vaccine. You may need this if you have certain risk factors.  Talk to your health care provider about which screenings and vaccines you need and how often you need them. This information is not intended to replace advice given to you by your health care provider. Make sure you discuss any questions you have with your health care provider. Document Released: 09/09/2001 Document Revised: 04/02/2016 Document Reviewed: 05/15/2015 Elsevier Interactive Patient Education  [employer].

## 2018-05-20 NOTE — Progress Notes (Signed)
Subjective:    Patient ID: Jenna Harrington, female    DOB: 1981/07/08, 37 y.o.   MRN: [id number]  HPI  Ms. Sandall is a 37 year old female who presents today to establish care and discuss the problems mentioned below. Will obtain old records.  1) Elevated: Endorses elevated TSH around 6 years ago after her second pregnancy, she was prescribed a low dose levothyroxine for which she never started.   Immunizations: -Tetanus: Completed in 2012 -Influenza: Declines   Diet: She endorses a healthy diet Breakfast: Eggs Lunch: Tuna, chicken, salad Dinner: Meat, vegetables Snacks: Crackers, chips Desserts: 1-2 times weekly  Beverages: Water, coffee  Exercise: She is walking most days of the week Eye exam: Completed 2019 Dental exam: Completes semi-annually  Pap Smear: Completed in 2016   Review of Systems  Constitutional: Negative for unexpected weight change.  HENT: Negative for rhinorrhea.   Respiratory: Negative for cough and shortness of breath.   Cardiovascular: Negative for chest pain.  Gastrointestinal: Negative for constipation and diarrhea.  Genitourinary: Negative for difficulty urinating and menstrual problem.  Musculoskeletal: Negative for arthralgias and myalgias.  Skin: Negative for rash.  Allergic/Immunologic: Negative for environmental allergies.  Neurological: Negative for dizziness, numbness and headaches.  Psychiatric/Behavioral: The patient is not nervous/anxious.        Past Medical History:  Diagnosis Date  . Contact with or exposure to other viral diseases(V01.79)   . Decreased fetal movements, affecting management of mother, unspecified as to episode of care in pregnancy   . Localized superficial swelling, mass, or lump    skin nodule  . Low back pain   . Need for prophylactic vaccination and inoculation against influenza   . Other psoriasis   . Parvovirus complicating pregnancy, antepartum   . Personal history of other infectious and parasitic  disease    chicken pox  . UTI (urinary tract infection)      Social History   Socioeconomic History  . Marital status: Married    Spouse name: Not on file  . Number of children: Not on file  . Years of education: Not on file  . Highest education level: Not on file  Occupational History  . Not on file  Social Needs  . Financial resource strain: Not on file  . Food insecurity:    Worry: Not on file    Inability: Not on file  . Transportation needs:    Medical: Not on file    Non-medical: Not on file  Tobacco Use  . Smoking status: Never Smoker  . Smokeless tobacco: Never Used  Substance and Sexual Activity  . Alcohol use: Yes    Comment: occ beer not during pregnancy  . Drug use: No  . Sexual activity: Yes  Lifestyle  . Physical activity:    Days per week: Not on file    Minutes per session: Not on file  . Stress: Not on file  Relationships  . Social connections:    Talks on phone: Not on file    Gets together: Not on file    Attends religious service: Not on file    Active member of club or organization: Not on file    Attends meetings of clubs or organizations: Not on file    Relationship status: Not on file  . Intimate partner violence:    Fear of current or ex partner: Not on file    Emotionally abused: Not on file    Physically abused: Not on file  Forced sexual activity: Not on file  Other Topics Concern  . Not on file  Social History Narrative   Married.   3 children.   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   Enjoys reading, spending time with family.     Past Surgical History:  Procedure Laterality Date  . CHOLECYSTECTOMY  2008    Family History  Problem Relation Age of Onset  . Anesthesia problems Mother   . Depression Mother   . Hyperlipidemia Mother   . Diabetes Mother   . COPD Father   . Hyperlipidemia Father   . COPD Maternal Grandmother   . Depression Maternal Grandmother   . Colon cancer Maternal Grandfather   .  Hypotension Neg Hx   . Malignant hyperthermia Neg Hx   . Pseudochol deficiency Neg Hx     Allergies  Allergen Reactions  . Bee Venom Swelling  . Shellfish Allergy Swelling  . Shellfish-Derived Products     No current outpatient medications on file prior to visit.   No current facility-administered medications on file prior to visit.     BP 122/82   Pulse 76   Temp 98 F (36.7 C) (Oral)   Ht 5\' 4"  (1.626 m)   Wt 209 lb 12 oz (95.1 kg)   LMP 04/30/2018   SpO2 98%   BMI 36.00 kg/m    Objective:   Physical Exam  Constitutional: She is oriented to person, place, and time. She appears well-nourished.  HENT:  Mouth/Throat: No oropharyngeal exudate.  Eyes: Pupils are equal, round, and reactive to light. EOM are normal.  Neck: Neck supple. No thyromegaly present.  Cardiovascular: Normal rate and regular rhythm.  Respiratory: Effort normal and breath sounds normal.  GI: Soft. Bowel sounds are normal. There is no tenderness.  Genitourinary: There is no tenderness or lesion on the right labia. There is no tenderness or lesion on the left labia. Cervix exhibits discharge. Cervix exhibits no motion tenderness. Right adnexum displays no tenderness. Left adnexum displays no tenderness. No erythema in the vagina. No vaginal discharge found.  Genitourinary Comments: Mild amount of whitish discharge. No foul smell.   Musculoskeletal: Normal range of motion.  Neurological: She is alert and oriented to person, place, and time.  Skin: Skin is warm and dry.  Psychiatric: She has a normal mood and affect.           Assessment & Plan:

## 2018-05-20 NOTE — Assessment & Plan Note (Signed)
Td UTD, declines influenza vaccination. Pap smear due, completed today. Recommended to increase exercise, work on diet. Exam unremarkable. Labs pending. Follow up in 1 year for CPE.

## 2018-05-20 NOTE — Assessment & Plan Note (Signed)
Endorses a history of this 6 years ago, never took levothyroxine as prescribed. Repeat TSH with free T4 pending.

## 2018-05-21 ENCOUNTER — Encounter: Payer: Self-pay | Admitting: *Deleted

## 2018-05-24 LAB — CYTOLOGY - PAP
Diagnosis: NEGATIVE
HPV: NOT DETECTED

## 2020-01-27 NOTE — Telephone Encounter (Signed)
This encounter was created in error - please disregard.

## 2020-11-05 ENCOUNTER — Ambulatory Visit: Payer: 59 | Admitting: Dermatology

## 2020-11-05 ENCOUNTER — Other Ambulatory Visit: Payer: Self-pay

## 2020-11-05 ENCOUNTER — Encounter: Payer: Self-pay | Admitting: Dermatology

## 2020-11-05 DIAGNOSIS — L821 Other seborrheic keratosis: Secondary | ICD-10-CM

## 2020-11-05 DIAGNOSIS — Z1283 Encounter for screening for malignant neoplasm of skin: Secondary | ICD-10-CM | POA: Diagnosis not present

## 2020-11-05 DIAGNOSIS — L820 Inflamed seborrheic keratosis: Secondary | ICD-10-CM

## 2020-11-05 DIAGNOSIS — L814 Other melanin hyperpigmentation: Secondary | ICD-10-CM | POA: Diagnosis not present

## 2020-11-05 DIAGNOSIS — D485 Neoplasm of uncertain behavior of skin: Secondary | ICD-10-CM

## 2020-11-05 NOTE — Patient Instructions (Signed)

## 2020-11-13 ENCOUNTER — Encounter: Payer: Self-pay | Admitting: Dermatology

## 2020-11-13 NOTE — Addendum Note (Signed)
Addended by: Lavonna Monarch on: 11/13/2020 06:48 AM   Modules accepted: Level of Service

## 2020-12-03 NOTE — Progress Notes (Signed)
   New Patient   Subjective  Jenna Harrington is a 40 y.o. female who presents for the following: Annual Exam (Full body skin check. Patient has lesion on left shoulder x weeks, scaly, no bleeding, brown.).  General skin examination, newer spot on left shoulder Location:  Duration:  Quality:  Associated Signs/Symptoms: Modifying Factors:  Severity:  Timing: Context:    The following portions of the chart were reviewed this encounter and updated as appropriate:  Tobacco  Allergies  Meds  Problems  Med Hx  Surg Hx  Fam Hx      Objective  Well appearing patient in no apparent distress; mood and affect are within normal limits. Objective  Scalp: Full body skin check. No atypical moles. Per Dr. Denna Haggard mole under right knee (dermoscopic without atypia).  Objective  Head - Anterior (Face): For millimeter monochrome tan symmetric macules; dermoscopy confirms  Objective  Left Shoulder - Anterior: Pink-tan inflamed 6 mm crust; favor I SK over a superficial carcinoma.     Objective  Right Flank: 7 mm light brown flattopped textured papule    A full examination was performed including scalp, head, eyes, ears, nose, lips, neck, chest, axillae, abdomen, back, buttocks, bilateral upper extremities, bilateral lower extremities, hands, feet, fingers, toes, fingernails, and toenails. All findings within normal limits unless otherwise noted below.  Areas beneath undergarments not fully examined   Assessment & Plan  Screening exam for skin cancer Scalp  Yearly skin check.;  Encouraged to self examine with spouse twice annually.  Continued ultraviolet protection.  Solar lentigo Head - Anterior (Face)  Leave if stable.  Neoplasm of uncertain behavior of skin Left Shoulder - Anterior  Skin / nail biopsy Type of biopsy: tangential   Informed consent: discussed and consent obtained   Timeout: patient name, date of birth, surgical site, and procedure verified   Procedure  prep:  Patient was prepped and draped in usual sterile fashion (Non sterile) Prep type:  Chlorhexidine Anesthesia: the lesion was anesthetized in a standard fashion   Anesthetic:  1% lidocaine w/ epinephrine 1-100,000 local infiltration Instrument used: flexible razor blade   Hemostasis achieved with: electrodesiccation   Outcome: patient tolerated procedure well   Post-procedure details: wound care instructions given    Specimen 1 - Surgical pathology Differential Diagnosis: isk/ wart Cautery  Check Margins: No  Seborrheic keratosis Right Flank  Leave is stable.

## 2020-12-21 ENCOUNTER — Ambulatory Visit (INDEPENDENT_AMBULATORY_CARE_PROVIDER_SITE_OTHER): Payer: 59 | Admitting: Primary Care

## 2020-12-21 ENCOUNTER — Other Ambulatory Visit: Payer: Self-pay

## 2020-12-21 ENCOUNTER — Encounter: Payer: Self-pay | Admitting: Primary Care

## 2020-12-21 VITALS — BP 122/76 | HR 88 | Temp 98.2°F | Ht 64.0 in | Wt 200.0 lb

## 2020-12-21 DIAGNOSIS — Z1231 Encounter for screening mammogram for malignant neoplasm of breast: Secondary | ICD-10-CM

## 2020-12-21 DIAGNOSIS — Z0000 Encounter for general adult medical examination without abnormal findings: Secondary | ICD-10-CM | POA: Diagnosis not present

## 2020-12-21 DIAGNOSIS — N926 Irregular menstruation, unspecified: Secondary | ICD-10-CM | POA: Diagnosis not present

## 2020-12-21 LAB — COMPREHENSIVE METABOLIC PANEL
Alkaline phosphatase (APISO): 41 U/L (ref 31–125)
BUN: 18 mg/dL (ref 7–25)
Globulin: 2.7 g/dL (calc) (ref 1.9–3.7)
Potassium: 4.6 mmol/L (ref 3.5–5.3)
Total Bilirubin: 0.8 mg/dL (ref 0.2–1.2)

## 2020-12-21 LAB — LIPID PANEL
Total CHOL/HDL Ratio: 4.2 (calc) (ref <5.0)
Triglycerides: 148 mg/dL (ref <150)

## 2020-12-21 LAB — CBC
HCT: 41.5 % (ref 35.0–45.0)
MPV: 11.1 fL (ref 7.5–12.5)
RDW: 12.4 % (ref 11.0–15.0)

## 2020-12-21 NOTE — Progress Notes (Signed)
Subjective:    Patient ID: Jenna Harrington, female    DOB: 18-Jun-1981, 40 y.o.   MRN: [id number]  HPI  Jenna Harrington is a very pleasant 39 y.o. female who presents today for complete physical. She has not been seen since 2019.  She would also like to discuss abnormal menses. March menses was lighter than usual, she had two menstrual cycles (5 days in between) in April, May menstrual cycle was normal.   Immunizations: -Tetanus: 2012, December  -Influenza: Did not complete this season  -Covid-19: Has not completed   Diet: Fair diet.  Exercise: No regular exercise.  Eye exam: Completes annually  Dental exam: Completes semi-annually   Pap Smear: October 2019 Mammogram: Never completed   BP Readings from Last 3 Encounters:  12/21/20 122/76  05/20/18 122/82  01/30/17 123/75     Review of Systems  Constitutional: Negative for unexpected weight change.  HENT: Negative for rhinorrhea.   Eyes: Negative for visual disturbance.  Respiratory: Negative for cough and shortness of breath.   Cardiovascular: Negative for chest pain.  Gastrointestinal: Negative for constipation and diarrhea.  Genitourinary: Positive for menstrual problem. Negative for difficulty urinating.       See HPI  Musculoskeletal: Negative for arthralgias and myalgias.  Skin: Negative for rash.  Allergic/Immunologic: Negative for environmental allergies.  Neurological: Negative for dizziness and headaches.  Psychiatric/Behavioral: The patient is not nervous/anxious.          Past Medical History:  Diagnosis Date  . Contact with or exposure to other viral diseases(V01.79)   . Decreased fetal movements, affecting management of mother, unspecified as to episode of care in pregnancy   . Localized superficial swelling, mass, or lump    skin nodule  . Low back pain   . Need for prophylactic vaccination and inoculation against influenza   . Other psoriasis   . Parvovirus complicating pregnancy, antepartum    . Personal history of other infectious and parasitic disease    chicken pox  . UTI (urinary tract infection)     Social History   Socioeconomic History  . Marital status: Married    Spouse name: Not on file  . Number of children: Not on file  . Years of education: Not on file  . Highest education level: Not on file  Occupational History  . Not on file  Tobacco Use  . Smoking status: Never Smoker  . Smokeless tobacco: Never Used  Substance and Sexual Activity  . Alcohol use: Yes    Comment: occ beer not during pregnancy  . Drug use: No  . Sexual activity: Yes  Other Topics Concern  . Not on file  Social History Narrative   Married.   3 children.   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   Enjoys reading, spending time with family.    Social Determinants of Health   Financial Resource Strain: Not on file  Food Insecurity: Not on file  Transportation Needs: Not on file  Physical Activity: Not on file  Stress: Not on file  Social Connections: Not on file  Intimate Partner Violence: Not on file    Past Surgical History:  Procedure Laterality Date  . CHOLECYSTECTOMY  2008    Family History  Problem Relation Age of Onset  . Anesthesia problems Mother   . Depression Mother   . Hyperlipidemia Mother   . Diabetes Mother   . COPD Father   . Hyperlipidemia Father   . COPD  Maternal Grandmother   . Depression Maternal Grandmother   . Colon cancer Maternal Grandfather   . Hypotension Neg Hx   . Malignant hyperthermia Neg Hx   . Pseudochol deficiency Neg Hx     Allergies  Allergen Reactions  . Bee Venom Swelling  . Shellfish Allergy Swelling  . Shellfish-Derived Products     No current outpatient medications on file prior to visit.   No current facility-administered medications on file prior to visit.    BP 122/76   Pulse 88   Temp 98.2 F (36.8 C) (Temporal)   Ht 5\' 4"  (1.626 m)   Wt 200 lb (90.7 kg)   LMP 12/06/2020 (Exact Date)    SpO2 98%   BMI 34.33 kg/m  Objective:   Physical Exam HENT:     Right Ear: Tympanic membrane and ear canal normal.     Left Ear: Tympanic membrane and ear canal normal.     Nose: Nose normal.  Eyes:     Conjunctiva/sclera: Conjunctivae normal.     Pupils: Pupils are equal, round, and reactive to light.  Neck:     Thyroid: No thyromegaly.  Cardiovascular:     Rate and Rhythm: Normal rate and regular rhythm.     Heart sounds: No murmur heard.   Pulmonary:     Effort: Pulmonary effort is normal.     Breath sounds: Normal breath sounds. No rales.  Abdominal:     General: Bowel sounds are normal.     Palpations: Abdomen is soft.     Tenderness: There is no abdominal tenderness.  Musculoskeletal:        General: Normal range of motion.     Cervical back: Neck supple.  Lymphadenopathy:     Cervical: No cervical adenopathy.  Skin:    General: Skin is warm and dry.     Findings: No rash.  Neurological:     Mental Status: She is alert and oriented to person, place, and time.     Cranial Nerves: No cranial nerve deficit.     Deep Tendon Reflexes: Reflexes are normal and symmetric.  Psychiatric:        Mood and Affect: Mood normal.           Assessment & Plan:      This visit occurred during the SARS-CoV-2 public health emergency.  Safety protocols were in place, including screening questions prior to the visit, additional usage of staff PPE, and extensive cleaning of exam room while observing appropriate contact time as indicated for disinfecting solutions.

## 2020-12-21 NOTE — Assessment & Plan Note (Signed)
Light in March, two menstrual cycles in April, May normal.   Checking labs today.  Discussed to monitor and if she continues to notice abnormality we would proceed with transvaginal/pelvic ultrasound.   She will update.

## 2020-12-21 NOTE — Assessment & Plan Note (Signed)
Tetanus due in December 2012, will provide at next visit. Pap smear due after October 2022, will complete next year.  Mammogram due, orders placed.  Discussed the importance of a healthy diet and regular exercise in order for weight loss, and to reduce the risk of any potential medical problems.  Exam today as noted. Labs pending.

## 2020-12-21 NOTE — Patient Instructions (Signed)
Stop by the lab prior to leaving today. I will notify you of your results once received.   [facility].  Please update me if you continue to notice abnormal cycles.  It was a pleasure to see you today!   Preventive Care 41-40 Years Old, Female Preventive care refers to lifestyle choices and visits with your health care provider that can promote health and wellness. This includes:  A yearly physical exam. This is also called an annual wellness visit.  Regular dental and eye exams.  Immunizations.  Screening for certain conditions.  Healthy lifestyle choices, such as: ? Eating a healthy diet. ? Getting regular exercise. ? Not using drugs or products that contain nicotine and tobacco. ? Limiting alcohol use. What can I expect for my preventive care visit? Physical exam Your health care provider will check your:  Height and weight. These may be used to calculate your BMI (body mass index). BMI is a measurement that tells if you are at a healthy weight.  Heart rate and blood pressure.  Body temperature.  Skin for abnormal spots. Counseling Your health care provider may ask you questions about your:  Past medical problems.  Family's medical history.  Alcohol, tobacco, and drug use.  Emotional well-being.  Home life and relationship well-being.  Sexual activity.  Diet, exercise, and sleep habits.  Work and work Statistician.  Access to firearms.  Method of birth control.  Menstrual cycle.  Pregnancy history. What immunizations do I need? Vaccines are usually given at various ages, according to a schedule. Your health care provider will recommend vaccines for you based on your age, medical history, and lifestyle or other factors, such as travel or where you work.   What tests do I need? Blood tests  Lipid and cholesterol levels. These may be checked every 5 years, or more often if you are over 90 years old.  Hepatitis C  test.  Hepatitis B test. Screening  Lung cancer screening. You may have this screening every year starting at age 41 if you have a 30-pack-year history of smoking and currently smoke or have quit within the past 15 years.  Colorectal cancer screening. ? All adults should have this screening starting at age 75 and continuing until age 41. ? Your health care provider may recommend screening at age 41 if you are at increased risk. ? You will have tests every 1-10 years, depending on your results and the type of screening test.  Diabetes screening. ? This is done by checking your blood sugar (glucose) after you have not eaten for a while (fasting). ? You may have this done every 1-3 years.  Mammogram. ? This may be done every 1-2 years. ? Talk with your health care provider about when you should start having regular mammograms. This may depend on whether you have a family history of breast cancer.  BRCA-related cancer screening. This may be done if you have a family history of breast, ovarian, tubal, or peritoneal cancers.  Pelvic exam and Pap test. ? This may be done every 3 years starting at age 41. ? Starting at age 31, this may be done every 5 years if you have a Pap test in combination with an HPV test. Other tests  STD (sexually transmitted disease) testing, if you are at risk.  Bone density scan. This is done to screen for osteoporosis. You may have this scan if you are at high risk for osteoporosis. Talk with your  health care provider about your test results, treatment options, and if necessary, the need for more tests. Follow these instructions at home: Eating and drinking  Eat a diet that includes fresh fruits and vegetables, whole grains, lean protein, and low-fat dairy products.  Take vitamin and mineral supplements as recommended by your health care provider.  Do not drink alcohol if: ? Your health care provider tells you not to drink. ? You are pregnant, may be  pregnant, or are planning to become pregnant.  If you drink alcohol: ? Limit how much you have to 0-1 drink a day. ? Be aware of how much alcohol is in your drink. In the [place] one drink equals one 12 oz bottle of beer (355 mL), one 5 oz glass of wine (148 mL), or one 1 oz glass of hard liquor (44 mL).   Lifestyle  Take daily care of your teeth and gums. Brush your teeth every morning and night with fluoride toothpaste. Floss one time each day.  Stay active. Exercise for at least 30 minutes 5 or more days each week.  Do not use any products that contain nicotine or tobacco, such as cigarettes, e-cigarettes, and chewing tobacco. If you need help quitting, ask your health care provider.  Do not use drugs.  If you are sexually active, practice safe sex. Use a condom or other form of protection to prevent STIs (sexually transmitted infections).  If you do not wish to become pregnant, use a form of birth control. If you plan to become pregnant, see your health care provider for a prepregnancy visit.  If told by your health care provider, take low-dose aspirin daily starting at age 36.  Find healthy ways to cope with stress, such as: ? Meditation, yoga, or listening to music. ? Journaling. ? Talking to a trusted person. ? Spending time with friends and family. Safety  Always wear your seat belt while driving or riding in a vehicle.  Do not drive: ? If you have been drinking alcohol. Do not ride with someone who has been drinking. ? When you are tired or distracted. ? While texting.  Wear a helmet and other protective equipment during sports activities.  If you have firearms in your house, make sure you follow all gun safety procedures. What's next?  Visit your health care provider once a year for an annual wellness visit.  Ask your health care provider how often you should have your eyes and teeth checked.  Stay up to date on all vaccines. This information is not intended to  replace advice given to you by your health care provider. Make sure you discuss any questions you have with your health care provider. Document Revised: 04/17/2020 Document Reviewed: 03/25/2018 Elsevier Patient Education  2021 [employer].

## 2020-12-22 LAB — COMPREHENSIVE METABOLIC PANEL
AG Ratio: 1.8 (calc) (ref 1.0–2.5)
ALT: 33 U/L — ABNORMAL HIGH (ref 6–29)
AST: 20 U/L (ref 10–30)
Albumin: 4.9 g/dL (ref 3.6–5.1)
CO2: 27 mmol/L (ref 20–32)
Calcium: 10 mg/dL (ref 8.6–10.2)
Chloride: 101 mmol/L (ref 98–110)
Creat: 0.76 mg/dL (ref 0.50–1.10)
Glucose, Bld: 90 mg/dL (ref 65–99)
Sodium: 138 mmol/L (ref 135–146)
Total Protein: 7.6 g/dL (ref 6.1–8.1)

## 2020-12-22 LAB — CBC
Hemoglobin: 13.8 g/dL (ref 11.7–15.5)
MCH: 29.9 pg (ref 27.0–33.0)
MCHC: 33.3 g/dL (ref 32.0–36.0)
MCV: 89.8 fL (ref 80.0–100.0)
Platelets: 271 10*3/uL (ref 140–400)
RBC: 4.62 10*6/uL (ref 3.80–5.10)
WBC: 6.9 10*3/uL (ref 3.8–10.8)

## 2020-12-22 LAB — LIPID PANEL
Cholesterol: 268 mg/dL — ABNORMAL HIGH (ref <200)
HDL: 64 mg/dL (ref >=50)
LDL Cholesterol (Calc): 175 mg/dL (calc) — ABNORMAL HIGH
Non-HDL Cholesterol (Calc): 204 mg/dL (calc) — ABNORMAL HIGH (ref <130)

## 2020-12-22 LAB — HEMOGLOBIN A1C
Hgb A1c MFr Bld: 5 % of total Hgb (ref <5.7)
Mean Plasma Glucose: 97 mg/dL
eAG (mmol/L): 5.4 mmol/L

## 2020-12-22 LAB — TSH: TSH: 2.73 mIU/L

## 2021-01-02 ENCOUNTER — Other Ambulatory Visit: Payer: Self-pay

## 2021-01-02 DIAGNOSIS — E785 Hyperlipidemia, unspecified: Secondary | ICD-10-CM

## 2021-02-18 ENCOUNTER — Ambulatory Visit
Admission: RE | Admit: 2021-02-18 | Discharge: 2021-02-18 | Disposition: A | Discharge status: Home / Self Care | Payer: 59 | Source: Ambulatory Visit | Attending: Primary Care | Admitting: Primary Care

## 2021-02-18 ENCOUNTER — Other Ambulatory Visit: Payer: Self-pay

## 2021-02-18 DIAGNOSIS — Z1231 Encounter for screening mammogram for malignant neoplasm of breast: Secondary | ICD-10-CM

## 2021-06-04 ENCOUNTER — Other Ambulatory Visit: Payer: 59

## 2021-11-05 ENCOUNTER — Ambulatory Visit: Payer: 59 | Admitting: Physician Assistant

## 2021-12-24 ENCOUNTER — Encounter: Payer: 59 | Admitting: Primary Care

## 2023-05-20 ENCOUNTER — Ambulatory Visit: Payer: 59 | Admitting: Nurse Practitioner

## 2023-05-20 ENCOUNTER — Encounter: Payer: Self-pay | Admitting: Nurse Practitioner

## 2023-05-20 VITALS — BP 124/80 | HR 79 | Ht 65.0 in | Wt 213.4 lb

## 2023-05-20 DIAGNOSIS — Z636 Dependent relative needing care at home: Secondary | ICD-10-CM

## 2023-05-20 DIAGNOSIS — Z23 Encounter for immunization: Secondary | ICD-10-CM

## 2023-05-20 DIAGNOSIS — Z0000 Encounter for general adult medical examination without abnormal findings: Secondary | ICD-10-CM

## 2023-05-20 DIAGNOSIS — R7989 Other specified abnormal findings of blood chemistry: Secondary | ICD-10-CM | POA: Diagnosis not present

## 2023-05-20 DIAGNOSIS — E669 Obesity, unspecified: Secondary | ICD-10-CM | POA: Diagnosis not present

## 2023-05-20 NOTE — Patient Instructions (Signed)
WEIGHT LOSS PLANNING Your progress today shows:     05/20/2023    9:25 AM 12/21/2020    2:07 PM 05/20/2018    9:50 AM  Vitals with BMI  Height 5\' 5"  5\' 4"  5\' 4"   Weight 213 lbs 6 oz 200 lbs 209 lbs 12 oz  BMI 35.51 34.31 35.99  Systolic 124 122 409  Diastolic 80 76 82  Pulse 79 88 76    For best management of weight, it is vital to balance intake versus output. This means the number of calories burned per day must be less than the calories you take in with food and drink.   I recommend trying to follow a diet with the following: Calories: 1200-1500 calories per day Carbohydrates: 150-180 grams of carbohydrates per day  Why: Gives your body enough "quick fuel" for cells to maintain normal function without sending them into starvation mode.  Protein: At least 90 grams of protein per day- 30 grams with each meal Why: Protein takes longer and uses more energy than carbohydrates to break down for fuel. The carbohydrates in your meals serves as quick energy sources and proteins help use some of that extra quick energy to break down to produce long term energy. This helps you not feel hungry as quickly and protein breakdown burns calories.  Water: Drink AT LEAST 64 ounces of water per day  Why: Water is essential to healthy metabolism. Water helps to fill the stomach and keep you fuller longer. Water is required for healthy digestion and filtering of waste in the body.  Fat: Limit fats in your diet- when choosing fats, choose foods with lower fats content such as lean meats (chicken, fish, [place]).  Why: Increased fat intake leads to storage "for later". Once you burn your carbohydrate energy, your body goes into fat and protein breakdown mode to help you loose weight.  Cholesterol: Fats and oils that are LIQUID at room temperature are best. Choose vegetable oils (olive oil, avocado oil, nuts). Avoid fats that are SOLID at room temperature (animal fats, processed meats). Healthy fats are often  found in whole grains, beans, nuts, seeds, and berries.  Why: Elevated cholesterol levels lead to build up of cholesterol on the inside of your blood vessels. This will eventually cause the blood vessels to become hard and can lead to high blood pressure and damage to your organs. When the blood flow is reduced, but the pressure is high from cholesterol buildup, parts of the cholesterol can break off and form clots that can go to the brain or heart leading to a stroke or heart attack.  Fiber: Increase amount of SOLUBLE the fiber in your diet. This helps to fill you up, lowers cholesterol, and helps with digestion. Some foods high in soluble fiber are oats, peas, beans, apples, carrots, barley, and citrus fruits.   Why: Fiber fills you up, helps remove excess cholesterol, and aids in healthy digestion which are all very important in weight management.   I recommend the following as a minimum activity routine: Purposeful walk or other physical activity at least 20 minutes every single day. This means purposefully taking a walk, jog, bike, swim, treadmill, elliptical, dance, etc.  This activity should be ABOVE your normal daily activities, such as walking at work. Goal exercise should be at least 150 minutes a week- work your way up to this.   Heart Rate: Your maximum exercise heart rate should be 220 - Your Age in Years. When exercising, get your  heart rate up, but avoid going over the maximum targeted heart rate.  60-70% of your maximum heart rate is where you tend to burn the most fat. To find this number:  220 - Age In Years= Max HR  Max HR x 0.6 (or 0.7) = Fat Burning HR The Fat Burning HR is your goal heart rate while working out to burn the most fat.  NEVER exercise to the point your feel lightheaded, weak, nauseated, dizzy. If you experience ANY of these symptoms- STOP exercise! Allow yourself to cool down and your heart rate to come down. Then restart slower next time.  If at ANY TIME you  feel chest pain or chest pressure during exercise, STOP IMMEDIATELY and seek medical attention.
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
## 2023-05-22 ENCOUNTER — Encounter: Payer: Self-pay | Admitting: Nurse Practitioner

## 2023-05-22 DIAGNOSIS — E782 Mixed hyperlipidemia: Secondary | ICD-10-CM

## 2023-05-22 DIAGNOSIS — E669 Obesity, unspecified: Secondary | ICD-10-CM

## 2023-05-26 DIAGNOSIS — Z636 Dependent relative needing care at home: Secondary | ICD-10-CM | POA: Insufficient documentation

## 2023-05-26 HISTORY — DX: Dependent relative needing care at home: Z63.6

## 2023-05-26 NOTE — Assessment & Plan Note (Signed)

## 2023-05-26 NOTE — Assessment & Plan Note (Signed)
Patient is experiencing significant stress due to caregiving responsibilities for her mother and raising her own children. -Discussed the importance of self-care and seeking help if feeling overwhelmed.

## 2023-05-26 NOTE — Assessment & Plan Note (Signed)
Patient reports recent weight gain despite maintaining regular exercise and diet. Possible contributing factors include stress and potential perimenopausal changes. -Order labs to check for potential metabolic contributors such as insulin resistance or thyroid disease. -Consider GLP-1 injectable medicine (Zepbound) for weight management if labs are normal and patient continues to struggle with weight loss.

## 2023-05-26 NOTE — Assessment & Plan Note (Signed)
Repeat labs today. No medications.

## 2023-05-27 DIAGNOSIS — E782 Mixed hyperlipidemia: Secondary | ICD-10-CM | POA: Insufficient documentation

## 2023-05-27 MED ORDER — TIRZEPATIDE-WEIGHT MANAGEMENT 2.5 MG/0.5ML ~~LOC~~ SOLN: 2.5000 mg | SUBCUTANEOUS | 0 refills | Status: DC

## 2023-05-27 NOTE — Telephone Encounter (Signed)
Patient has tried diet and exercise for more than 6 months with limited impact on weight management.   Previously has utilized weight management apps and programs designed to track calories, carbohydrates, and fats as well as exercise goals to manage healthy weight loss.   Co morbidities include hyperlipidemia and BMI of 35.51%.  She is currently walking 3-5 miles daily and closely monitoring her carbohydrates, fat, and calorie intake. In the last 30 days, she has noticed an increase in weight despite these factors.   She has a strong family history of difficult to control diabetes, HLD, and dementia in her mother and HLD in her father. There is also a history of colon cancer in her maternal grandfather.   We are awaiting insulin levels to monitor for evidence of insulin resistance as a contributor.   Based on the patients current weight and co-morbid conditions, coupled with family history and risk factors, weight management is imperative to aid in reduction of risks for progression to serious, life altering conditions. It is my opinion that aggressive weight management is required as a part of a treatment and preventative health plan for this patient.

## 2023-05-29 ENCOUNTER — Telehealth: Payer: Self-pay

## 2023-05-29 NOTE — Telephone Encounter (Signed)
Key: [id number] PA Case ID #: [id number] Rx #: [id number] Status: sent iconSent to Plan today Drug: Zepbound 2.5MG /0.5ML solution Form: [employer] Electronic Prior Authorization Form (2017 NCPDP)

## 2023-06-01 NOTE — Telephone Encounter (Signed)
Key: [id number] PA Case ID #: [id number] Rx #: [id number] Outcome: Approved on November 1 by [facility] 2017 NCPDP Request Reference Number: [id number].   ZEPBOUND INJ 2.5MG  is approved through 11/26/2023. Your patient may now fill this prescription and it will be covered.  Authorization Expiration Date: 11/26/2023 Drug: Zepbound 2.5MG /0.5ML solution Form: [facility] Electronic Prior Authorization Form [phone] NCPDP)

## 2023-06-01 NOTE — Telephone Encounter (Signed)
Approval faxed to pharmacy. Left pt a voicemail.

## 2023-06-03 ENCOUNTER — Encounter: Payer: Self-pay | Admitting: Nurse Practitioner

## 2023-06-04 ENCOUNTER — Other Ambulatory Visit: Payer: Self-pay

## 2023-06-04 LAB — CBC WITH DIFFERENTIAL/PLATELET
Basophils Absolute: 0.1 10*3/uL (ref 0.0–0.2)
Basos: 1 %
EOS (ABSOLUTE): 0.2 10*3/uL (ref 0.0–0.4)
Eos: 3 %
Hematocrit: 42.7 % (ref 34.0–46.6)
Hemoglobin: 13.8 g/dL (ref 11.1–15.9)
Immature Grans (Abs): 0.1 10*3/uL (ref 0.0–0.1)
Immature Granulocytes: 1 %
Lymphocytes Absolute: 1.7 10*3/uL (ref 0.7–3.1)
Lymphs: 30 %
MCH: 29.8 pg (ref 26.6–33.0)
MCHC: 32.3 g/dL (ref 31.5–35.7)
MCV: 92 fL (ref 79–97)
Monocytes Absolute: 0.5 10*3/uL (ref 0.1–0.9)
Monocytes: 8 %
Neutrophils Absolute: 3.4 10*3/uL (ref 1.4–7.0)
Neutrophils: 57 %
Platelets: 298 10*3/uL (ref 150–450)
RBC: 4.63 x10E6/uL (ref 3.77–5.28)
RDW: 12.3 % (ref 11.7–15.4)
WBC: 5.9 10*3/uL (ref 3.4–10.8)

## 2023-06-04 LAB — COMPREHENSIVE METABOLIC PANEL
ALT: 23 [IU]/L (ref 0–32)
AST: 15 [IU]/L (ref 0–40)
Albumin: 4.7 g/dL (ref 3.9–4.9)
Alkaline Phosphatase: 49 [IU]/L (ref 44–121)
BUN/Creatinine Ratio: 19 (ref 9–23)
BUN: 14 mg/dL (ref 6–24)
Bilirubin Total: 0.4 mg/dL (ref 0.0–1.2)
CO2: 23 mmol/L (ref 20–29)
Calcium: 9.9 mg/dL (ref 8.7–10.2)
Chloride: 102 mmol/L (ref 96–106)
Creatinine, Ser: 0.74 mg/dL (ref 0.57–1.00)
Globulin, Total: 2.6 g/dL (ref 1.5–4.5)
Glucose: 103 mg/dL — ABNORMAL HIGH (ref 70–99)
Potassium: 4.8 mmol/L (ref 3.5–5.2)
Sodium: 139 mmol/L (ref 134–144)
Total Protein: 7.3 g/dL (ref 6.0–8.5)
eGFR: 104 mL/min/{1.73_m2} (ref >59)

## 2023-06-04 LAB — INSULIN, FREE AND TOTAL
Free Insulin: 7.8 uU/mL
Total Insulin: 7.8 uU/mL

## 2023-06-04 LAB — LIPID PANEL
Chol/HDL Ratio: 3.8 ratio (ref 0.0–4.4)
Cholesterol, Total: 257 mg/dL — ABNORMAL HIGH (ref 100–199)
HDL: 67 mg/dL (ref >39)
LDL Chol Calc (NIH): 161 mg/dL — ABNORMAL HIGH (ref 0–99)
Triglycerides: 162 mg/dL — ABNORMAL HIGH (ref 0–149)
VLDL Cholesterol Cal: 29 mg/dL (ref 5–40)

## 2023-06-04 LAB — HEMOGLOBIN A1C
Est. average glucose Bld gHb Est-mCnc: 105 mg/dL
Hgb A1c MFr Bld: 5.3 % (ref 4.8–5.6)

## 2023-06-04 LAB — VITAMIN D 25 HYDROXY (VIT D DEFICIENCY, FRACTURES): Vit D, 25-Hydroxy: 30.2 ng/mL (ref 30.0–100.0)

## 2023-06-04 LAB — TSH: TSH: 3.71 u[IU]/mL (ref 0.450–4.500)

## 2023-06-04 MED ORDER — ZEPBOUND 2.5 MG/0.5ML ~~LOC~~ SOAJ: 2.5000 mg | SUBCUTANEOUS | 1 refills | Status: DC

## 2023-06-29 ENCOUNTER — Encounter: Payer: Self-pay | Admitting: Nurse Practitioner

## 2023-06-29 ENCOUNTER — Other Ambulatory Visit: Payer: Self-pay | Admitting: Medical

## 2023-06-29 MED ORDER — ZEPBOUND 5 MG/0.5ML ~~LOC~~ SOAJ: 5.0000 mg | SUBCUTANEOUS | 0 refills | Status: DC

## 2023-07-07 ENCOUNTER — Other Ambulatory Visit: Payer: Self-pay | Admitting: Nurse Practitioner

## 2023-07-07 DIAGNOSIS — Z1231 Encounter for screening mammogram for malignant neoplasm of breast: Secondary | ICD-10-CM

## 2023-07-26 ENCOUNTER — Encounter: Payer: Self-pay | Admitting: Nurse Practitioner

## 2023-07-27 ENCOUNTER — Other Ambulatory Visit: Payer: Self-pay

## 2023-07-27 MED ORDER — TIRZEPATIDE 7.5 MG/0.5ML ~~LOC~~ SOAJ: 7.5000 mg | SUBCUTANEOUS | 0 refills | Status: DC

## 2023-07-27 MED ORDER — ZEPBOUND 7.5 MG/0.5ML ~~LOC~~ SOAJ: 7.5000 mg | SUBCUTANEOUS | 0 refills | Status: DC

## 2023-07-28 ENCOUNTER — Other Ambulatory Visit (HOSPITAL_COMMUNITY): Payer: Self-pay

## 2023-08-05 ENCOUNTER — Ambulatory Visit
Admission: RE | Admit: 2023-08-05 | Discharge: 2023-08-05 | Disposition: A | Discharge status: Home / Self Care | Payer: 59 | Source: Ambulatory Visit | Attending: Nurse Practitioner | Admitting: Nurse Practitioner

## 2023-08-05 DIAGNOSIS — Z1231 Encounter for screening mammogram for malignant neoplasm of breast: Secondary | ICD-10-CM

## 2023-08-07 ENCOUNTER — Other Ambulatory Visit: Payer: Self-pay | Admitting: Nurse Practitioner

## 2023-08-07 DIAGNOSIS — R928 Other abnormal and inconclusive findings on diagnostic imaging of breast: Secondary | ICD-10-CM

## 2023-08-24 ENCOUNTER — Other Ambulatory Visit: Payer: Self-pay

## 2023-08-24 MED ORDER — ZEPBOUND 7.5 MG/0.5ML ~~LOC~~ SOAJ: 7.5000 mg | SUBCUTANEOUS | 1 refills | Status: DC

## 2023-09-09 ENCOUNTER — Ambulatory Visit
Admission: RE | Admit: 2023-09-09 | Discharge: 2023-09-09 | Disposition: A | Discharge status: Home / Self Care | Payer: 59 | Source: Ambulatory Visit | Attending: Nurse Practitioner | Admitting: Nurse Practitioner

## 2023-09-09 ENCOUNTER — Ambulatory Visit
Admission: RE | Admit: 2023-09-09 | Discharge: 2023-09-09 | Disposition: A | Discharge status: Home / Self Care | Payer: 59 | Source: Ambulatory Visit | Attending: Nurse Practitioner

## 2023-09-09 DIAGNOSIS — R928 Other abnormal and inconclusive findings on diagnostic imaging of breast: Secondary | ICD-10-CM

## 2023-09-17 ENCOUNTER — Encounter: Payer: Self-pay | Admitting: Nurse Practitioner

## 2023-09-20 ENCOUNTER — Encounter: Payer: Self-pay | Admitting: Nurse Practitioner

## 2023-09-21 ENCOUNTER — Other Ambulatory Visit: Payer: Self-pay

## 2023-09-21 MED ORDER — ZEPBOUND 10 MG/0.5ML ~~LOC~~ SOAJ: 10.0000 mg | SUBCUTANEOUS | 2 refills | Status: DC

## 2023-09-28 ENCOUNTER — Other Ambulatory Visit: Payer: Self-pay

## 2023-09-28 MED ORDER — ZEPBOUND 10 MG/0.5ML ~~LOC~~ SOAJ: 10.0000 mg | SUBCUTANEOUS | 0 refills | Status: DC

## 2023-10-26 ENCOUNTER — Encounter: Payer: Self-pay | Admitting: Nurse Practitioner

## 2023-10-26 DIAGNOSIS — E782 Mixed hyperlipidemia: Secondary | ICD-10-CM

## 2023-10-26 DIAGNOSIS — E669 Obesity, unspecified: Secondary | ICD-10-CM

## 2023-10-30 MED ORDER — ZEPBOUND 12.5 MG/0.5ML ~~LOC~~ SOAJ: 12.5000 mg | SUBCUTANEOUS | 3 refills | Status: DC

## 2023-11-04 ENCOUNTER — Other Ambulatory Visit (HOSPITAL_COMMUNITY): Payer: Self-pay

## 2023-11-04 ENCOUNTER — Telehealth: Payer: Self-pay

## 2023-11-04 NOTE — Telephone Encounter (Signed)
 Pharmacy Patient Advocate Encounter   Received notification from CoverMyMeds that prior authorization for Zepbound 12.5MG /0.5ML pen-injectors is required/requested.   Insurance verification completed.   The patient is insured through [facility] .   Per test claim: PA required; PA submitted to above mentioned insurance via CoverMyMeds Key/confirmation #/EOC (Key: [id number])   Status is pending

## 2023-11-05 ENCOUNTER — Other Ambulatory Visit (HOSPITAL_COMMUNITY): Payer: Self-pay

## 2023-11-05 NOTE — Telephone Encounter (Signed)
 Pharmacy Patient Advocate Encounter  Received notification from [facility] that Prior Authorization for Zepbound 12.5MG /0.5ML pen-injectors  has been APPROVED from 4.9.25 to 4.926. Ran test claim, Copay is $24.99. This test claim was processed through [facility]- copay amounts may vary at other pharmacies due to pharmacy/plan contracts, or as the patient moves through the different stages of their insurance plan.   PA #/Case ID/Reference #: (Key: [id number])

## 2023-11-24 ENCOUNTER — Other Ambulatory Visit: Payer: Self-pay

## 2023-11-24 ENCOUNTER — Encounter: Payer: Self-pay | Admitting: Nurse Practitioner

## 2023-11-24 DIAGNOSIS — E669 Obesity, unspecified: Secondary | ICD-10-CM

## 2023-11-24 DIAGNOSIS — E782 Mixed hyperlipidemia: Secondary | ICD-10-CM

## 2023-11-24 MED ORDER — ZEPBOUND 12.5 MG/0.5ML ~~LOC~~ SOAJ: 12.5000 mg | SUBCUTANEOUS | 3 refills | Status: DC

## 2024-03-16 ENCOUNTER — Encounter: Payer: Self-pay | Admitting: Nurse Practitioner

## 2024-03-16 ENCOUNTER — Other Ambulatory Visit: Payer: Self-pay

## 2024-03-16 DIAGNOSIS — E669 Obesity, unspecified: Secondary | ICD-10-CM

## 2024-03-16 DIAGNOSIS — E782 Mixed hyperlipidemia: Secondary | ICD-10-CM

## 2024-03-16 MED ORDER — ZEPBOUND 12.5 MG/0.5ML ~~LOC~~ SOAJ: 12.5000 mg | SUBCUTANEOUS | 3 refills | Status: DC

## 2024-05-23 ENCOUNTER — Ambulatory Visit: Payer: 59 | Admitting: Nurse Practitioner

## 2024-05-23 ENCOUNTER — Encounter: Payer: Self-pay | Admitting: Nurse Practitioner

## 2024-05-23 VITALS — BP 122/80 | HR 87 | Ht 64.25 in | Wt 196.0 lb

## 2024-05-23 DIAGNOSIS — R7301 Impaired fasting glucose: Secondary | ICD-10-CM

## 2024-05-23 DIAGNOSIS — Z0000 Encounter for general adult medical examination without abnormal findings: Secondary | ICD-10-CM | POA: Diagnosis not present

## 2024-05-23 DIAGNOSIS — E669 Obesity, unspecified: Secondary | ICD-10-CM | POA: Diagnosis not present

## 2024-05-23 DIAGNOSIS — E782 Mixed hyperlipidemia: Secondary | ICD-10-CM | POA: Diagnosis not present

## 2024-05-23 DIAGNOSIS — R7989 Other specified abnormal findings of blood chemistry: Secondary | ICD-10-CM | POA: Diagnosis not present

## 2024-05-23 DIAGNOSIS — H6592 Unspecified nonsuppurative otitis media, left ear: Secondary | ICD-10-CM

## 2024-05-23 MED ORDER — ZEPBOUND 12.5 MG/0.5ML ~~LOC~~ SOAJ: 12.5000 mg | SUBCUTANEOUS | 3 refills | Status: AC

## 2024-05-23 MED ORDER — AZITHROMYCIN 250 MG PO TABS: ORAL_TABLET | ORAL | 0 refills | Status: AC

## 2024-05-23 NOTE — Assessment & Plan Note (Signed)
 Repeat labs today for evaluation. Not currently on medication management. Will monitor closely.

## 2024-05-23 NOTE — Progress Notes (Signed)
 Jenna Doing, DNP, AGNP-c [facility] [address] [place], [place] [address] Main Office [phone] VISIT TYPE: CPE on 05/23/2024 Today's Vitals   05/23/24 1011  BP: 122/80  Pulse: 87  Weight: 196 lb (88.9 kg)  Height: 5' 4.25 (1.632 m)   Body mass index is 33.38 kg/m. BP 122/80   Pulse 87   Ht 5' 4.25 (1.632 m)   Wt 196 lb (88.9 kg)   LMP 05/22/2024   BMI 33.38 kg/m   Subjective:    Patient ID: Jenna Harrington, female    DOB: [birth year], 43 y.o.   MRN: [id number]  HPI:  History of Present Illness Jenna Harrington is a 43 year old female who presents for her annual exam.  She has been experiencing discomfort in her ear for about a week, describing it as a sensation of clear fluid. Her throat has also felt irritated, initially more painful, and she attributes some drainage to crying and sinus issues.  She is currently taking Zepbound  at a dose of 12.5 mg for weight management and reports no side effects. Her menstrual cycles are regular in timing but vary in flow from super heavy to light, which she attributes to age and possible perimenopausal symptoms. She uses wild yam cream to help with mood and menstrual regularity.  Her mother recently passed away, which has been a significant emotional event for her and her family. Her mother had difficulty swallowing, and the family opted for hospice care instead of a feeding tube. She and her family were present at her mother's passing, finding it a meaningful experience despite the difficulty. Her children, aged 81, 68, and 55, have been coping with the loss, with the oldest in counseling. The family has been supportive of each other, recently attending a family wedding, which provided a positive gathering after the loss.  No shortness of breath, chest pain, or changes in bowel habits. No swelling in her feet or ankles, except possibly due to standing for long periods at a recent wedding. Pertinent items are noted  in HPI.  Most Recent Depression Screen:     05/23/2024   10:10 AM 05/20/2023    9:25 AM 12/21/2020    2:10 PM  Depression screen PHQ 2/9  Decreased Interest 0 0 0  Down, Depressed, Hopeless 0 0 0  PHQ - 2 Score 0 0 0  Altered sleeping   0  Tired, decreased energy   0  Change in appetite   0  Feeling bad or failure about yourself    0  Trouble concentrating   0  Moving slowly or fidgety/restless   0  Suicidal thoughts   0  PHQ-9 Score   0  Difficult Harrington work/chores   Not difficult at all   Most Recent Anxiety Screen:      No data to display         Most Recent Fall Screen:    05/23/2024   10:10 AM 05/20/2023    9:25 AM  Fall Risk   Falls in the past year? 0 0  Number falls in past yr: 0 0  Injury with Fall? 0 0  Risk for fall due to : No Fall Risks No Fall Risks  Follow up Falls evaluation completed Falls evaluation completed    Past medical history, surgical history, medications, allergies, family history and social history reviewed with patient today and changes made to appropriate areas of the chart.  Past Medical History:  Past Medical History:  Diagnosis  Date   Caregiver stress 05/26/2023   Contact with or exposure to other viral diseases(V01.79)    Decreased fetal movements, affecting management of mother, unspecified as to episode of care in pregnancy    High risk medication use 01/05/2013   Localized superficial swelling, mass, or lump    skin nodule   Low back pain    Need for prophylactic vaccination and inoculation against influenza    Other psoriasis    Parvovirus complicating pregnancy, antepartum    Personal history of other infectious and parasitic disease    chicken pox   UTI (urinary tract infection)    Medications:  No current outpatient medications on file prior to visit.   No current facility-administered medications on file prior to visit.   Surgical History:  Past Surgical History:  Procedure Laterality Date   CHOLECYSTECTOMY   2008   Allergies:  Allergies  Allergen Reactions   Bee Venom Swelling   Shellfish Allergy Swelling   Shellfish Protein-Containing Drug Products    Family History:  Family History  Problem Relation Age of Onset   Anesthesia problems Mother    Depression Mother    Hyperlipidemia Mother    Diabetes Mother    COPD Father    Hyperlipidemia Father    COPD Maternal Grandmother    Depression Maternal Grandmother    Colon cancer Maternal Grandfather    Hypotension Neg Hx    Malignant hyperthermia Neg Hx    Pseudochol deficiency Neg Hx    Breast cancer Neg Hx        Objective:    BP 122/80   Pulse 87   Ht 5' 4.25 (1.632 m)   Wt 196 lb (88.9 kg)   LMP 05/22/2024   BMI 33.38 kg/m   Wt Readings from Last 3 Encounters:  05/23/24 196 lb (88.9 kg)  05/20/23 213 lb 6.4 oz (96.8 kg)  12/21/20 200 lb (90.7 kg)    Physical Exam Vitals and nursing note reviewed.  Constitutional:      General: She is not in acute distress.    Appearance: Normal appearance.  HENT:     Head: Normocephalic and atraumatic.     Right Ear: Hearing, ear canal and external ear normal. A middle ear effusion is present. Tympanic membrane is erythematous.     Left Ear: Hearing, ear canal and external ear normal.     Nose: Nose normal.     Right Sinus: No maxillary sinus tenderness or frontal sinus tenderness.     Left Sinus: No maxillary sinus tenderness or frontal sinus tenderness.     Mouth/Throat:     Lips: Pink.     Mouth: Mucous membranes are moist.     Pharynx: Oropharynx is clear.  Eyes:     General: Lids are normal. Vision grossly intact.     Extraocular Movements: Extraocular movements intact.     Conjunctiva/sclera: Conjunctivae normal.     Pupils: Pupils are equal, round, and reactive to light.     Funduscopic exam:    Right eye: Red reflex present.        Left eye: Red reflex present.    Visual Fields: Right eye visual fields normal and left eye visual fields normal.  Neck:      Thyroid : No thyromegaly.     Vascular: No carotid bruit.  Cardiovascular:     Rate and Rhythm: Normal rate and regular rhythm.     Chest Wall: PMI is not displaced.     Pulses:  Normal pulses.          Dorsalis pedis pulses are 2+ on the right side and 2+ on the left side.       Posterior tibial pulses are 2+ on the right side and 2+ on the left side.     Heart sounds: Normal heart sounds. No murmur heard. Pulmonary:     Effort: Pulmonary effort is normal. No respiratory distress.     Breath sounds: Normal breath sounds.  Abdominal:     General: Abdomen is flat. Bowel sounds are normal. There is no distension.     Palpations: Abdomen is soft. There is no hepatomegaly, splenomegaly or mass.     Tenderness: There is no abdominal tenderness. There is no right CVA tenderness, left CVA tenderness, guarding or rebound.  Musculoskeletal:        General: Normal range of motion.     Cervical back: Full passive range of motion without pain, normal range of motion and neck supple. No tenderness.     Right lower leg: No edema.     Left lower leg: No edema.  Feet:     Left foot:     Toenail Condition: Left toenails are normal.  Lymphadenopathy:     Cervical: No cervical adenopathy.     Upper Body:     Right upper body: No supraclavicular adenopathy.     Left upper body: No supraclavicular adenopathy.  Skin:    General: Skin is warm and dry.     Capillary Refill: Capillary refill takes less than 2 seconds.     Nails: There is no clubbing.  Neurological:     General: No focal deficit present.     Mental Status: She is alert and oriented to person, place, and time.     GCS: GCS eye subscore is 4. GCS verbal subscore is 5. GCS motor subscore is 6.     Cranial Nerves: No cranial nerve deficit.     Sensory: Sensation is intact. No sensory deficit.     Motor: Motor function is intact. No weakness.     Coordination: Coordination is intact. Coordination normal.     Gait: Gait is intact.     Deep  Tendon Reflexes: Reflexes are normal and symmetric.  Psychiatric:        Attention and Perception: Attention normal.        Mood and Affect: Mood normal.        Speech: Speech normal.        Behavior: Behavior normal. Behavior is cooperative.        Thought Content: Thought content normal.        Cognition and Memory: Cognition and memory normal.        Judgment: Judgment normal.      Results for orders placed or performed in visit on 05/20/23  Insulin , Free and Total   Collection Time: 05/20/23 10:23 AM  Result Value Ref Range   Free Insulin  7.8 uU/mL   Total Insulin  7.8 uU/mL  Hemoglobin A1c   Collection Time: 05/20/23 10:23 AM  Result Value Ref Range   Hgb A1c MFr Bld 5.3 4.8 - 5.6 %   Est. average glucose Bld gHb Est-mCnc 105 mg/dL  CBC with Differential/Platelet   Collection Time: 05/20/23 10:23 AM  Result Value Ref Range   WBC 5.9 3.4 - 10.8 x10E3/uL   RBC 4.63 3.77 - 5.28 x10E6/uL   Hemoglobin 13.8 11.1 - 15.9 g/dL   Hematocrit 57.2 65.9 - 46.6 %  MCV 92 79 - 97 fL   MCH 29.8 26.6 - 33.0 pg   MCHC 32.3 31.5 - 35.7 g/dL   RDW 87.6 88.2 - 84.5 %   Platelets 298 150 - 450 x10E3/uL   Neutrophils 57 Not Estab. %   Lymphs 30 Not Estab. %   Monocytes 8 Not Estab. %   Eos 3 Not Estab. %   Basos 1 Not Estab. %   Neutrophils Absolute 3.4 1.4 - 7.0 x10E3/uL   Lymphocytes Absolute 1.7 0.7 - 3.1 x10E3/uL   Monocytes Absolute 0.5 0.1 - 0.9 x10E3/uL   EOS (ABSOLUTE) 0.2 0.0 - 0.4 x10E3/uL   Basophils Absolute 0.1 0.0 - 0.2 x10E3/uL   Immature Granulocytes 1 Not Estab. %   Immature Grans (Abs) 0.1 0.0 - 0.1 x10E3/uL  Comprehensive metabolic panel   Collection Time: 05/20/23 10:23 AM  Result Value Ref Range   Glucose 103 (H) 70 - 99 mg/dL   BUN 14 6 - 24 mg/dL   Creatinine, Ser 9.25 0.57 - 1.00 mg/dL   eGFR 895 >40 fO/fpw/8.26   BUN/Creatinine Ratio 19 9 - 23   Sodium 139 134 - 144 mmol/L   Potassium 4.8 3.5 - 5.2 mmol/L   Chloride 102 96 - 106 mmol/L   CO2 23 20 -  29 mmol/L   Calcium 9.9 8.7 - 10.2 mg/dL   Total Protein 7.3 6.0 - 8.5 g/dL   Albumin 4.7 3.9 - 4.9 g/dL   Globulin, Total 2.6 1.5 - 4.5 g/dL   Bilirubin Total 0.4 0.0 - 1.2 mg/dL   Alkaline Phosphatase 49 44 - 121 IU/L   AST 15 0 - 40 IU/L   ALT 23 0 - 32 IU/L  Lipid panel   Collection Time: 05/20/23 10:23 AM  Result Value Ref Range   Cholesterol, Total 257 (H) 100 - 199 mg/dL   Triglycerides 837 (H) 0 - 149 mg/dL   HDL 67 >60 mg/dL   VLDL Cholesterol Cal 29 5 - 40 mg/dL   LDL Chol Calc (NIH) 838 (H) 0 - 99 mg/dL   Chol/HDL Ratio 3.8 0.0 - 4.4 ratio  TSH   Collection Time: 05/20/23 10:23 AM  Result Value Ref Range   TSH 3.710 0.450 - 4.500 uIU/mL  VITAMIN D  25 Hydroxy (Vit-D Deficiency, Fractures)   Collection Time: 05/20/23 10:23 AM  Result Value Ref Range   Vit D, 25-Hydroxy 30.2 30.0 - 100.0 ng/mL       Assessment & Plan:   Problem List Items Addressed This Visit     Encounter for annual physical exam - Primary   CPE completed today. Review of HM activities and recommendations discussed and provided on AVS. Anticipatory guidance, diet, and exercise recommendations provided. Medications, allergies, and hx reviewed and updated as necessary. Orders placed as listed below.  Plan: - Labs ordered. Will make changes as necessary based on results.  - I will review these results and send recommendations via MyChart or a telephone call.  - F/U with CPE in 1 year or sooner for acute/chronic health needs as directed.        Elevated TSH   Repeat labs today for monitoring.      Obesity (BMI 30-39.9)   Currently managed with Zepbound  12.5 mg. No side effects reported. Periods have normalized in timing but vary in flow, possibly due to perimenopausal changes. 17lb weight loss so far.  - Continue Zepbound  12.5 mg - OK to increase dose when desired      Relevant  Medications   tirzepatide  (ZEPBOUND ) 12.5 MG/0.5ML Pen   Other Relevant Orders   Hemoglobin A1c   TSH   T4, free    Mixed hyperlipidemia   Repeat labs today for evaluation. Not currently on medication management. Will monitor closely.       Relevant Medications   tirzepatide  (ZEPBOUND ) 12.5 MG/0.5ML Pen   Other Relevant Orders   CBC with Differential/Platelet   Comprehensive metabolic panel with GFR   Lipid panel   Other Visit Diagnoses       Elevated fasting blood sugar       Relevant Orders   Hemoglobin A1c     Left otitis media with effusion       Relevant Medications   azithromycin (ZITHROMAX) 250 MG tablet        Follow up plan: Return in about 1 year (around 05/23/2025) for CPE.  NEXT PREVENTATIVE PHYSICAL DUE IN 1 YEAR.  PATIENT COUNSELING PROVIDED FOR ALL ADULT PATIENTS: A well balanced diet low in saturated fats, cholesterol, and moderation in carbohydrates.  This can be as simple as monitoring portion sizes and cutting back on sugary beverages such as soda and juice to start with.    Daily water consumption of at least 64 ounces.  Physical activity at least 180 minutes per week.  If just starting out, start 10 minutes a day and work your way up.   This can be as simple as taking the stairs instead of the elevator and walking 2-3 laps around the office  purposefully every day.   STD protection, partner selection, and regular testing if high risk.  Limited consumption of alcoholic beverages if alcohol is consumed. For men, I recommend no more than 14 alcoholic beverages per week, spread out throughout the week (max 2 per day). Avoid binge drinking or consuming large quantities of alcohol in one setting.  Please let me know if you feel you may need help with reduction or quitting alcohol consumption.   Avoidance of nicotine, if used. Please let me know if you feel you may need help with reduction or quitting nicotine use.   Daily mental health attention. This can be in the form of 5 minute daily meditation, prayer, journaling, yoga, reflection, etc.  Purposeful  attention to your emotions and mental state can significantly improve your overall wellbeing  and  Health.  Please know that I am here to help you with all of your health care goals and am happy to work with you to find a solution that works best for you.  The greatest advice I have received with any changes in life are to take it one step at a time, that even means if all you can focus on is the next 60 seconds, then do that and celebrate your victories.  With any changes in life, you will have set backs, and that is OK. The important thing to remember is, if you have a set back, it is not a failure, it is an opportunity to try again! Screening Testing Mammogram Every 1 -2 years based on history and risk factors Starting at age 43 Pap Smear Ages 21-39 every 3 years Ages 66-65 every 5 years with HPV testing More frequent testing may be required based on results and history Colon Cancer Screening Every 1-10 years based on test performed, risk factors, and history Starting at age 74 Bone Density Screening Every 2-10 years based on history Starting at age 11 for women Recommendations for men differ based on medication  usage, history, and risk factors AAA Screening One time ultrasound Men 10-51 years old who have every smoked Lung Cancer Screening Low Dose Lung CT every 12 months Age 49-80 years with a 30 pack-year smoking history who still smoke or who have quit within the last 15 years   Screening Labs Routine  Labs: Complete Blood Count (CBC), Complete Metabolic Panel (CMP), Cholesterol (Lipid Panel) Every 6-12 months based on history and medications May be recommended more frequently based on current conditions or previous results Hemoglobin A1c Lab Every 3-12 months based on history and previous results Starting at age 40 or earlier with diagnosis of diabetes, high cholesterol, BMI >26, and/or risk factors Frequent monitoring for patients with diabetes to ensure blood sugar  control Thyroid  Panel (TSH) Every 6 months based on history, symptoms, and risk factors May be repeated more often if on medication HIV One time testing for all patients 36 and older May be repeated more frequently for patients with increased risk factors or exposure Hepatitis C One time testing for all patients 18 and older May be repeated more frequently for patients with increased risk factors or exposure Gonorrhea, Chlamydia Every 12 months for all sexually active persons 13-24 years Additional monitoring may be recommended for those who are considered high risk or who have symptoms Every 12 months for any woman on birth control, regardless of sexual activity PSA Men 52-44 years old with risk factors Additional screening may be recommended from age 48-69 based on risk factors, symptoms, and history  Vaccine Recommendations Tetanus Booster All adults every 10 years Flu Vaccine All patients 6 months and older every year COVID Vaccine All patients 12 years and older Initial dosing with booster May recommend additional booster based on age and health history HPV Vaccine 2 doses all patients age 37-26 Dosing may be considered for patients over 26 Shingles Vaccine (Shingrix) 2 doses all adults 55 years and older Pneumonia (Pneumovax 23) All adults 65 years and older May recommend earlier dosing based on health history One year apart from Prevnar 13 Pneumonia (Prevnar 48) All adults 65 years and older Dosed 1 year after Pneumovax 23 Pneumonia (Prevnar 20) One time alternative to the two dosing of 13 and 23 For all adults with initial dose of 23, 20 is recommended 1 year later For all adults with initial dose of 13, 23 is still recommended as second option 1 year later

## 2024-05-23 NOTE — Assessment & Plan Note (Signed)
 Currently managed with Zepbound  12.5 mg. No side effects reported. Periods have normalized in timing but vary in flow, possibly due to perimenopausal changes. 17lb weight loss so far.  - Continue Zepbound  12.5 mg - OK to increase dose when desired

## 2024-05-23 NOTE — Assessment & Plan Note (Signed)
 Repeat labs today for monitoring.

## 2024-05-23 NOTE — Assessment & Plan Note (Signed)

## 2024-05-23 NOTE — Patient Instructions (Addendum)
 It was so good to see you today! Please reach out if you have any needs or concerns.    For all adult patients, I recommend A well balanced diet low in saturated fats, cholesterol, and moderation in carbohydrates.   This can be as simple as monitoring portion sizes and cutting back on sugary beverages such as soda and juice to start with.    Daily water consumption of at least 64 ounces.  Physical activity at least 180 minutes per week, if just starting out.   This can be as simple as taking the stairs instead of the elevator and walking 2-3 laps around the office  purposefully every day.   STD protection, partner selection, and regular testing if high risk.  Limited consumption of alcoholic beverages if alcohol is consumed.  For women, I recommend no more than 7 alcoholic beverages per week, spread out throughout the week.  Avoid binge drinking or consuming large quantities of alcohol in one setting.   Please let me know if you feel you may need help with reduction or quitting alcohol consumption.   Avoidance of nicotine, if used.  Please let me know if you feel you may need help with reduction or quitting nicotine use.   Daily mental health attention.  This can be in the form of 5 minute daily meditation, prayer, journaling, yoga, reflection, etc.   Purposeful attention to your emotions and mental state can significantly improve your overall wellbeing  and  Health.  Please know that I am here to help you with all of your health care goals and am happy to work with you to find a solution that works best for you.  The greatest advice I have received with any changes in life are to take it one step at a time, that even means if all you can focus on is the next 60 seconds, then do that and celebrate your victories.  With any changes in life, you will have set backs, and that is OK. The important thing to remember is, if you have a set back, it is not a failure, it is an opportunity to try  again!  Health Maintenance Recommendations Screening Testing Mammogram Every 1 -2 years based on history and risk factors Starting at age 22 Pap Smear Ages 21-39 every 3 years Ages 46-65 every 5 years with HPV testing More frequent testing may be required based on results and history Colon Cancer Screening Every 1-10 years based on test performed, risk factors, and history Starting at age 35 Bone Density Screening Every 2-10 years based on history Starting at age 44 for women Recommendations for men differ based on medication usage, history, and risk factors AAA Screening One time ultrasound Men 85-4 years old who have every smoked Lung Cancer Screening Low Dose Lung CT every 12 months Age 40-80 years with a 30 pack-year smoking history who still smoke or who have quit within the last 15 years  Screening Labs Routine  Labs: Complete Blood Count (CBC), Complete Metabolic Panel (CMP), Cholesterol (Lipid Panel) Every 6-12 months based on history and medications May be recommended more frequently based on current conditions or previous results Hemoglobin A1c Lab Every 3-12 months based on history and previous results Starting at age 40 or earlier with diagnosis of diabetes, high cholesterol, BMI >26, and/or risk factors Frequent monitoring for patients with diabetes to ensure blood sugar control Thyroid  Panel (TSH w/ T3 & T4) Every 6 months based on history, symptoms, and risk factors  May be repeated more often if on medication HIV One time testing for all patients 69 and older May be repeated more frequently for patients with increased risk factors or exposure Hepatitis C One time testing for all patients 49 and older May be repeated more frequently for patients with increased risk factors or exposure Gonorrhea, Chlamydia Every 12 months for all sexually active persons 13-24 years Additional monitoring may be recommended for those who are considered high risk or who have  symptoms PSA Men 91-39 years old with risk factors Additional screening may be recommended from age 35-69 based on risk factors, symptoms, and history  Vaccine Recommendations Tetanus Booster All adults every 10 years Flu Vaccine All patients 6 months and older every year COVID Vaccine All patients 12 years and older Initial dosing with booster May recommend additional booster based on age and health history HPV Vaccine 2 doses all patients age 21-26 Dosing may be considered for patients over 26 Shingles Vaccine (Shingrix) 2 doses all adults 55 years and older Pneumonia (Pneumovax 23) All adults 65 years and older May recommend earlier dosing based on health history Pneumonia (Prevnar 11) All adults 65 years and older Dosed 1 year after Pneumovax 23  Additional Screening, Testing, and Vaccinations may be recommended on an individualized basis based on family history, health history, risk factors, and/or exposure.

## 2024-05-24 LAB — CBC WITH DIFFERENTIAL/PLATELET
Basophils Absolute: 0.1 x10E3/uL (ref 0.0–0.2)
Basos: 1 %
EOS (ABSOLUTE): 0.1 x10E3/uL (ref 0.0–0.4)
Eos: 2 %
Hematocrit: 43.5 % (ref 34.0–46.6)
Hemoglobin: 14.3 g/dL (ref 11.1–15.9)
Immature Grans (Abs): 0 x10E3/uL (ref 0.0–0.1)
Immature Granulocytes: 0 %
Lymphocytes Absolute: 1.8 x10E3/uL (ref 0.7–3.1)
Lymphs: 26 %
MCH: 29.5 pg (ref 26.6–33.0)
MCHC: 32.9 g/dL (ref 31.5–35.7)
MCV: 90 fL (ref 79–97)
Monocytes Absolute: 0.5 x10E3/uL (ref 0.1–0.9)
Monocytes: 8 %
Neutrophils Absolute: 4.3 x10E3/uL (ref 1.4–7.0)
Neutrophils: 62 %
Platelets: 297 x10E3/uL (ref 150–450)
RBC: 4.85 x10E6/uL (ref 3.77–5.28)
RDW: 12.4 % (ref 11.7–15.4)
WBC: 6.9 x10E3/uL (ref 3.4–10.8)

## 2024-05-24 LAB — LIPID PANEL
Chol/HDL Ratio: 4.4 ratio (ref 0.0–4.4)
Cholesterol, Total: 280 mg/dL — ABNORMAL HIGH (ref 100–199)
HDL: 64 mg/dL (ref >39)
LDL Chol Calc (NIH): 184 mg/dL — ABNORMAL HIGH (ref 0–99)
Triglycerides: 173 mg/dL — ABNORMAL HIGH (ref 0–149)
VLDL Cholesterol Cal: 32 mg/dL (ref 5–40)

## 2024-05-24 LAB — COMPREHENSIVE METABOLIC PANEL WITH GFR
ALT: 21 IU/L (ref 0–32)
AST: 14 IU/L (ref 0–40)
Albumin: 4.8 g/dL (ref 3.9–4.9)
Alkaline Phosphatase: 48 IU/L (ref 41–116)
BUN/Creatinine Ratio: 15 (ref 9–23)
BUN: 11 mg/dL (ref 6–24)
Bilirubin Total: 0.4 mg/dL (ref 0.0–1.2)
CO2: 21 mmol/L (ref 20–29)
Calcium: 9.5 mg/dL (ref 8.7–10.2)
Chloride: 102 mmol/L (ref 96–106)
Creatinine, Ser: 0.71 mg/dL (ref 0.57–1.00)
Globulin, Total: 2.5 g/dL (ref 1.5–4.5)
Glucose: 89 mg/dL (ref 70–99)
Potassium: 4.4 mmol/L (ref 3.5–5.2)
Sodium: 136 mmol/L (ref 134–144)
Total Protein: 7.3 g/dL (ref 6.0–8.5)
eGFR: 108 mL/min/1.73 (ref >59)

## 2024-05-24 LAB — HEMOGLOBIN A1C
Est. average glucose Bld gHb Est-mCnc: 94 mg/dL
Hgb A1c MFr Bld: 4.9 % (ref 4.8–5.6)

## 2024-05-24 LAB — T4, FREE: Free T4: 1.14 ng/dL (ref 0.82–1.77)

## 2024-05-24 LAB — TSH: TSH: 2.81 u[IU]/mL (ref 0.450–4.500)

## 2024-05-31 ENCOUNTER — Ambulatory Visit: Payer: Self-pay | Admitting: Nurse Practitioner

## 2025-06-05 ENCOUNTER — Encounter: Admitting: Nurse Practitioner
